# Patient Record
Sex: Male | Born: 1960 | Race: White | Hispanic: No | Marital: Married | Smoking: Former smoker
Health system: Southern US, Community
[De-identification: ages and names within clinical notes are randomized; demographics above are authoritative.]

## PROBLEM LIST (undated history)

## (undated) DIAGNOSIS — I10 Essential (primary) hypertension: Secondary | ICD-10-CM

## (undated) DIAGNOSIS — T7840XA Allergy, unspecified, initial encounter: Secondary | ICD-10-CM

## (undated) DIAGNOSIS — E119 Type 2 diabetes mellitus without complications: Secondary | ICD-10-CM

## (undated) DIAGNOSIS — E785 Hyperlipidemia, unspecified: Secondary | ICD-10-CM

## (undated) HISTORY — DX: Allergy, unspecified, initial encounter: T78.40XA

## (undated) HISTORY — PX: CHOLECYSTECTOMY: SHX55

## (undated) HISTORY — DX: Type 2 diabetes mellitus without complications: E11.9

## (undated) HISTORY — PX: VASECTOMY: SHX75

## (undated) HISTORY — DX: Hyperlipidemia, unspecified: E78.5

## (undated) HISTORY — PX: ANAL FISSURECTOMY: SUR608

## (undated) HISTORY — PX: BACK SURGERY: SHX140

## (undated) HISTORY — PX: SPINE SURGERY: SHX786

## (undated) HISTORY — DX: Essential (primary) hypertension: I10

## (undated) HISTORY — PX: HEMORRHOID SURGERY: SHX153

---

## 2005-08-27 ENCOUNTER — Ambulatory Visit: Payer: Self-pay | Admitting: Chiropractic Medicine

## 2005-10-09 ENCOUNTER — Ambulatory Visit (HOSPITAL_COMMUNITY): Admission: RE | Admit: 2005-10-09 | Discharge: 2005-10-09 | Payer: Self-pay | Admitting: Neurosurgery

## 2015-07-15 ENCOUNTER — Other Ambulatory Visit: Payer: Self-pay | Admitting: Family Medicine

## 2015-07-15 DIAGNOSIS — I10 Essential (primary) hypertension: Secondary | ICD-10-CM

## 2015-07-15 MED ORDER — VALSARTAN-HYDROCHLOROTHIAZIDE 320-12.5 MG PO TABS: 1.0000 | ORAL_TABLET | Freq: Every day | ORAL | Status: DC

## 2019-12-26 LAB — BASIC METABOLIC PANEL
BUN: 20 (ref 4–21)
CO2: 22 (ref 13–22)
Chloride: 100 (ref 99–108)
Creatinine: 0.8 (ref 0.6–1.3)
Glucose: 123

## 2019-12-26 LAB — COMPREHENSIVE METABOLIC PANEL
Albumin: 4.4 (ref 3.5–5.0)
Calcium: 9.8 (ref 8.7–10.7)

## 2019-12-26 LAB — HEMOGLOBIN A1C
Hemoglobin A1C: 5.9
Hemoglobin A1C: 5.9

## 2019-12-26 LAB — HEPATIC FUNCTION PANEL
Alkaline Phosphatase: 45 (ref 25–125)
Bilirubin, Total: 0.3

## 2020-01-14 LAB — BASIC METABOLIC PANEL
BUN: 20 (ref 4–21)
Creatinine: 0.8 (ref 0.6–1.3)

## 2020-01-14 LAB — HEPATIC FUNCTION PANEL
Alkaline Phosphatase: 45 (ref 25–125)
Bilirubin, Total: 0.3

## 2020-01-14 LAB — LIPID PANEL
Cholesterol: 6 (ref 0–200)
HDL: 34 — AB (ref 35–70)
LDL Cholesterol: 121
LDl/HDL Ratio: 5.9
Triglycerides: 220 — AB (ref 40–160)

## 2020-01-14 LAB — COMPREHENSIVE METABOLIC PANEL
Albumin: 4.4 (ref 3.5–5.0)
Calcium: 9.8 (ref 8.7–10.7)
GFR calc non Af Amer: 111

## 2020-09-30 ENCOUNTER — Encounter: Payer: Self-pay | Admitting: Family Medicine

## 2020-09-30 ENCOUNTER — Ambulatory Visit (INDEPENDENT_AMBULATORY_CARE_PROVIDER_SITE_OTHER): Payer: PRIVATE HEALTH INSURANCE | Admitting: Family Medicine

## 2020-09-30 VITALS — BP 121/90 | HR 65 | Temp 98.6°F | Ht 71.0 in | Wt 216.0 lb

## 2020-09-30 DIAGNOSIS — M25512 Pain in left shoulder: Secondary | ICD-10-CM | POA: Diagnosis not present

## 2020-09-30 DIAGNOSIS — R7303 Prediabetes: Secondary | ICD-10-CM | POA: Diagnosis not present

## 2020-09-30 DIAGNOSIS — D489 Neoplasm of uncertain behavior, unspecified: Secondary | ICD-10-CM

## 2020-09-30 DIAGNOSIS — Z7689 Persons encountering health services in other specified circumstances: Secondary | ICD-10-CM

## 2020-09-30 DIAGNOSIS — I10 Essential (primary) hypertension: Secondary | ICD-10-CM

## 2020-09-30 DIAGNOSIS — G8929 Other chronic pain: Secondary | ICD-10-CM

## 2020-09-30 LAB — POCT GLYCOSYLATED HEMOGLOBIN (HGB A1C): Hemoglobin A1C: 6 % — AB (ref 4.0–5.6)

## 2020-09-30 MED ORDER — VALSARTAN-HYDROCHLOROTHIAZIDE 320-12.5 MG PO TABS: 1.0000 | ORAL_TABLET | Freq: Every day | ORAL | 3 refills | Status: DC

## 2020-09-30 NOTE — Progress Notes (Signed)
New patient visit   Patient: Steven Ramirez   DOB: 1961/03/26   60 y.o. Male  MRN: [id number] Visit Date: 09/30/2020  Today's healthcare provider: Vernie Murders, PA-C   Chief Complaint  Patient presents with   Establish Care   Subjective    Steven Ramirez is a 60 y.o. male who presents today as a new patient to establish care.   History of : Diabetes/Prediabetes Hypertension Left shoulder pain posteriorly. Lesion on right posterior neck.   No past medical history on file.   Past Surgical History:  Procedure Laterality Date   ANAL FISSURECTOMY     HEMORRHOID SURGERY     SPINE SURGERY     VASECTOMY     Family Status  Relation Name Status   Mother  Alive   Father  Alive   Brother  Alive   Daughter  Alive   Brother  Alive   Neg Hx  (Not Specified)   Family History  Problem Relation Age of Onset   Crohn's disease Mother    Hypothyroidism Mother    Gout Father    Hypertension Father    Diabetes Brother    Hypothyroidism Daughter    Diabetes Brother    Colon cancer Neg Hx    Prostate cancer Neg Hx    Breast cancer Neg Hx    Social History   Socioeconomic History   Marital status: Married    Spouse name: Not on file   Number of children: Not on file   Years of education: Not on file   Highest education level: Not on file  Occupational History   Not on file  Tobacco Use   Smoking status: Never Smoker   Smokeless tobacco: Never Used  Vaping Use   Vaping Use: Never used  Substance and Sexual Activity   Alcohol use: Yes    Alcohol/week: 15.0 standard drinks    Types: 15 Glasses of wine per week   Drug use: Never   Sexual activity: Not on file  Other Topics Concern   Not on file  Social History Narrative   Not on file   Social Determinants of Health   Financial Resource Strain: Not on file  Food Insecurity: Not on file  Transportation Needs: Not on file  Physical Activity: Not on file  Stress: Not on file  Social Connections: Not on file    Outpatient Medications Prior to Visit  Medication Sig   valsartan-hydrochlorothiazide (DIOVAN HCT) 320-12.5 MG tablet Take 1 tablet by mouth daily.   No facility-administered medications prior to visit.   Allergies  Allergen Reactions   Peach [Prunus Persica]     There is no immunization history on file for this patient.  Health Maintenance  Topic Date Due   COVID-19 Vaccine (1) Never done   HIV Screening  Never done   Hepatitis C Screening  Never done   COLONOSCOPY (Pts 45-36yrs Insurance coverage will need to be confirmed)  Never done   TETANUS/TDAP  02/04/2020   INFLUENZA VACCINE  12/12/2020   HPV VACCINES  Aged Out    Patient Care Team: Chong January, Driscilla Grammes as PCP - General (Family Medicine)  Review of Systems  Constitutional: Negative.   HENT: Negative.    Respiratory: Negative.    Cardiovascular: Negative.   Gastrointestinal: Negative.   Musculoskeletal:        Left shoulder pain.  Skin:  Positive for rash.     Objective    BP 121/90 (BP  Location: Right Arm, Patient Position: Sitting, Cuff Size: Large)   Pulse 65   Temp 98.6 F (37 C) (Oral)   Ht 5\' 11"  (1.803 m)   Wt 216 lb (98 kg)   SpO2 98%   BMI 30.13 kg/m  Physical Exam Constitutional:      Appearance: He is well-developed.  HENT:     Head: Normocephalic and atraumatic.     Right Ear: External ear normal.     Left Ear: External ear normal.     Nose: Nose normal.  Eyes:     General:        Right eye: No discharge.     Conjunctiva/sclera: Conjunctivae normal.     Pupils: Pupils are equal, round, and reactive to light.  Neck:     Thyroid: No thyromegaly.     Trachea: No tracheal deviation.  Cardiovascular:     Rate and Rhythm: Normal rate and regular rhythm.     Heart sounds: Normal heart sounds. No murmur heard.   Pulmonary:     Effort: Pulmonary effort is normal. No respiratory distress.     Breath sounds: Normal breath sounds. No wheezing or rales.  Chest:     Chest wall: No  tenderness.  Abdominal:     General: There is no distension.     Palpations: Abdomen is soft. There is no mass.     Tenderness: There is no abdominal tenderness. There is no guarding or rebound.  Musculoskeletal:        General: Tenderness present. Normal range of motion.     Cervical back: Normal range of motion and neck supple.     Comments: Discomfort in the left posterior shoulder to reach behind back or in an overhead throwing motion.  Lymphadenopathy:     Cervical: No cervical adenopathy.  Skin:    General: Skin is warm and dry.     Findings: Lesion present. No erythema or rash.     Comments: Cobblestone appearing lesion in a 2 cm diameter area on the right posterior neck.  Neurological:     Mental Status: He is alert and oriented to person, place, and time.     Cranial Nerves: No cranial nerve deficit.     Motor: No abnormal muscle tone.     Coordination: Coordination normal.     Deep Tendon Reflexes: Reflexes are normal and symmetric. Reflexes normal.  Psychiatric:        Behavior: Behavior normal.        Thought Content: Thought content normal.        Judgment: Judgment normal.     No results found for any visits on 09/30/20.  Assessment & Plan     1. Encounter to establish care Moving back to the [facility] area and wanting to re-establish in this practice.   2. Prediabetes Hgb A1C up to 6.0% today. Recommend low fat reduced calorie diet to help bring BMI down (over 30 today). Should exercise 30-40 minutes 3-4 days a week (walking). Check routine labs. - POCT glycosylated hemoglobin (Hb A1C) - CBC with Differential/Platelet - Comprehensive metabolic panel - Lipid panel  3. Essential hypertension BP borderline today. Needs refill of Diovan-HCT and get follow up labs. - CBC with Differential/Platelet - Comprehensive metabolic panel - Lipid panel - TSH - valsartan-hydrochlorothiazide (DIOVAN HCT) 320-12.5 MG tablet; Take 1 tablet by mouth daily.  Dispense: 90  tablet; Refill: 3  4. Chronic left shoulder pain No specific injury known. Pain to reach behind back or  overhead in a throwing motion for the past several months. May use Aleve and stretching exercises. If no better soon, may need orthopedic referral.  5. Neoplasm of uncertain behavior Onset several months ago. Recommend dermatology evaluation. - Ambulatory referral to Dermatology   No follow-ups on file.     I, Abdulkarim Eberlin, PA-C, have reviewed all documentation for this visit. The documentation on 09/30/20 for the exam, diagnosis, procedures, and orders are all accurate and complete.    Vernie Murders, PA-C  [employer] [phone] (phone) [phone] (fax)  [place]

## 2020-10-22 LAB — CBC WITH DIFFERENTIAL/PLATELET
Basophils Absolute: 0 10*3/uL (ref 0.0–0.2)
Basos: 1 %
EOS (ABSOLUTE): 0.2 10*3/uL (ref 0.0–0.4)
Eos: 4 %
Hematocrit: 42.7 % (ref 37.5–51.0)
Hemoglobin: 14.6 g/dL (ref 13.0–17.7)
Immature Grans (Abs): 0 10*3/uL (ref 0.0–0.1)
Immature Granulocytes: 0 %
Lymphocytes Absolute: 2.2 10*3/uL (ref 0.7–3.1)
Lymphs: 47 %
MCH: 31.8 pg (ref 26.6–33.0)
MCHC: 34.2 g/dL (ref 31.5–35.7)
MCV: 93 fL (ref 79–97)
Monocytes Absolute: 0.5 10*3/uL (ref 0.1–0.9)
Monocytes: 11 %
Neutrophils Absolute: 1.7 10*3/uL (ref 1.4–7.0)
Neutrophils: 37 %
Platelets: 154 10*3/uL (ref 150–450)
RBC: 4.59 x10E6/uL (ref 4.14–5.80)
RDW: 12.6 % (ref 11.6–15.4)
WBC: 4.5 10*3/uL (ref 3.4–10.8)

## 2020-10-22 LAB — COMPREHENSIVE METABOLIC PANEL
ALT: 50 IU/L — ABNORMAL HIGH (ref 0–44)
AST: 26 IU/L (ref 0–40)
Albumin/Globulin Ratio: 1.8 (ref 1.2–2.2)
Albumin: 4.6 g/dL (ref 3.8–4.9)
Alkaline Phosphatase: 43 IU/L — ABNORMAL LOW (ref 44–121)
BUN/Creatinine Ratio: 18 (ref 10–24)
BUN: 20 mg/dL (ref 8–27)
Bilirubin Total: 0.8 mg/dL (ref 0.0–1.2)
CO2: 25 mmol/L (ref 20–29)
Calcium: 10 mg/dL (ref 8.6–10.2)
Chloride: 101 mmol/L (ref 96–106)
Creatinine, Ser: 1.1 mg/dL (ref 0.76–1.27)
Globulin, Total: 2.5 g/dL (ref 1.5–4.5)
Glucose: 118 mg/dL — ABNORMAL HIGH (ref 65–99)
Potassium: 4.1 mmol/L (ref 3.5–5.2)
Sodium: 141 mmol/L (ref 134–144)
Total Protein: 7.1 g/dL (ref 6.0–8.5)
eGFR: 77 mL/min/{1.73_m2} (ref >59)

## 2020-10-22 LAB — LIPID PANEL
Chol/HDL Ratio: 5.8 ratio — ABNORMAL HIGH (ref 0.0–5.0)
Cholesterol, Total: 193 mg/dL (ref 100–199)
HDL: 33 mg/dL — ABNORMAL LOW (ref >39)
LDL Chol Calc (NIH): 118 mg/dL — ABNORMAL HIGH (ref 0–99)
Triglycerides: 239 mg/dL — ABNORMAL HIGH (ref 0–149)
VLDL Cholesterol Cal: 42 mg/dL — ABNORMAL HIGH (ref 5–40)

## 2020-10-22 LAB — TSH: TSH: 2.67 u[IU]/mL (ref 0.450–4.500)

## 2020-10-23 ENCOUNTER — Encounter: Payer: Self-pay | Admitting: Family Medicine

## 2020-10-24 ENCOUNTER — Encounter: Payer: Self-pay | Admitting: Family Medicine

## 2020-10-24 ENCOUNTER — Telehealth: Payer: Self-pay

## 2020-10-24 MED ORDER — ATORVASTATIN CALCIUM 20 MG PO TABS: 20.0000 mg | ORAL_TABLET | Freq: Every day | ORAL | 3 refills | Status: DC

## 2020-10-24 NOTE — Telephone Encounter (Signed)
-----   Message from Margo Common, PA-C sent at 10/24/2020  8:23 AM EDT ----- All blood tests essentially normal except blood sugar slightly up with Hgb A1C in prediabetic range. LDL cholesterol elevated, HDL cholesterol low and triglycerides high. Need Atorvastatin 20 mg qd #90 & 3RF with low fat diet. Reducing alcohol intake would help sugar, also. Should recheck progress in 3 months.

## 2020-10-31 ENCOUNTER — Encounter: Payer: Self-pay | Admitting: Family Medicine

## 2020-11-04 ENCOUNTER — Encounter: Payer: Self-pay | Admitting: Family Medicine

## 2021-03-15 ENCOUNTER — Other Ambulatory Visit: Payer: Self-pay

## 2021-03-15 ENCOUNTER — Ambulatory Visit: Payer: 59 | Admitting: Dermatology

## 2021-03-15 DIAGNOSIS — L281 Prurigo nodularis: Secondary | ICD-10-CM

## 2021-03-15 DIAGNOSIS — D229 Melanocytic nevi, unspecified: Secondary | ICD-10-CM

## 2021-03-15 DIAGNOSIS — Z1283 Encounter for screening for malignant neoplasm of skin: Secondary | ICD-10-CM | POA: Diagnosis not present

## 2021-03-15 DIAGNOSIS — L814 Other melanin hyperpigmentation: Secondary | ICD-10-CM

## 2021-03-15 DIAGNOSIS — Q825 Congenital non-neoplastic nevus: Secondary | ICD-10-CM

## 2021-03-15 DIAGNOSIS — L570 Actinic keratosis: Secondary | ICD-10-CM

## 2021-03-15 DIAGNOSIS — L821 Other seborrheic keratosis: Secondary | ICD-10-CM

## 2021-03-15 DIAGNOSIS — L280 Lichen simplex chronicus: Secondary | ICD-10-CM | POA: Diagnosis not present

## 2021-03-15 DIAGNOSIS — L820 Inflamed seborrheic keratosis: Secondary | ICD-10-CM | POA: Diagnosis not present

## 2021-03-15 DIAGNOSIS — L578 Other skin changes due to chronic exposure to nonionizing radiation: Secondary | ICD-10-CM | POA: Diagnosis not present

## 2021-03-15 DIAGNOSIS — D1801 Hemangioma of skin and subcutaneous tissue: Secondary | ICD-10-CM

## 2021-03-15 MED ORDER — MOMETASONE FUROATE 0.1 % EX CREA: 1.0000 "application " | TOPICAL_CREAM | Freq: Every day | CUTANEOUS | 2 refills | Status: DC

## 2021-03-15 NOTE — Progress Notes (Signed)
New Patient Visit  Subjective  Steven Ramirez is a 60 y.o. male who presents for the following: Total body skin exam (No hx of skin ca) and check spot (Post scalp, non healing). New patient referral from [facility], Vernie Murders, PA-C The patient presents for Total-Body Skin Exam (TBSE) for skin cancer screening and mole check.  The following portions of the chart were reviewed this encounter and updated as appropriate:   Tobacco  Allergies  Meds  Problems  Med Hx  Surg Hx  Fam Hx     Review of Systems:  No other skin or systemic complaints except as noted in HPI or Assessment and Plan.  Objective  Well appearing patient in no apparent distress; mood and affect are within normal limits.  A full examination was performed including scalp, head, eyes, ears, nose, lips, neck, chest, axillae, abdomen, back, buttocks, bilateral upper extremities, bilateral lower extremities, hands, feet, fingers, toes, fingernails, and toenails. All findings within normal limits unless otherwise noted below.  R antecubital/forearm 1.5 x 0.8cm R post neck thickened skin with hyperkeratosis and lichenification  4.0 x 2.0cm hickened skin with hyperkeratosis and lichenification        R xyphoid Brown macule     Scalp, ears x 12 (12) Pink scaly macules   R medial knee x 1, Total = 1 Erythematous keratotic or waxy stuck-on papule or plaque.       Assessment & Plan   Lentigines - Scattered tan macules - Due to sun exposure - Benign-appearing, observe - Recommend daily broad spectrum sunscreen SPF 30+ to sun-exposed areas, reapply every 2 hours as needed. - Call for any changes  Seborrheic Keratoses - Stuck-on, waxy, tan-brown papules and/or plaques  - Benign-appearing - Discussed benign etiology and prognosis. - Observe - Call for any changes  Melanocytic Nevi - Tan-brown and/or pink-flesh-colored symmetric macules and papules - Benign appearing on exam  today - Observation - Call clinic for new or changing moles - Recommend daily use of broad spectrum spf 30+ sunscreen to sun-exposed areas.   Hemangiomas - Red papules - Discussed benign nature - Observe - Call for any changes  Actinic Damage - Chronic condition, secondary to cumulative UV/sun exposure - diffuse scaly erythematous macules with underlying dyspigmentation - Recommend daily broad spectrum sunscreen SPF 30+ to sun-exposed areas, reapply every 2 hours as needed.  - Staying in the shade or wearing long sleeves, sun glasses (UVA+UVB protection) and wide brim hats (4-inch brim around the entire circumference of the hat) are also recommended for sun protection.  - Call for new or changing lesions.  Skin cancer screening performed today.  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 Your physician has selected the right strength medicine for your problem and area affected on the body. Please use your medication only as directed by your physician to prevent side effects.    mometasone (ELOCON) 0.1 % cream - R antecubital/forearm Apply 1 application topically daily. Qd up to 5 days a week to aa neck, right arm until clear then prn flares  Congenital non-neoplastic nevus R xyphoid Benign-appearing.  Observation.  Call clinic  for new or changing moles.  Recommend daily use of broad spectrum spf 30+ sunscreen to sun-exposed areas.    AK (actinic keratosis) (12) Scalp, ears x 12 Destruction of lesion - Scalp, ears x 12 Complexity: simple    Destruction method: cryotherapy   Informed consent: discussed and consent obtained   Timeout:  patient name, date of birth, surgical site, and procedure verified Lesion destroyed using liquid nitrogen: Yes   Region frozen until ice ball extended beyond lesion: Yes   Outcome: patient tolerated procedure well with no complications   Post-procedure details: wound care instructions given    Inflamed seborrheic keratosis with lichen simplex R medial knee x 1, Total = 1 With [facility]  May start Mometasone cream qd up to 5d/wk  Destruction of lesion - R medial knee x 1, Total = 1 Complexity: simple   Destruction method: cryotherapy   Informed consent: discussed and consent obtained   Timeout:  patient name, date of birth, surgical site, and procedure verified Lesion destroyed using liquid nitrogen: Yes   Region frozen until ice ball extended beyond lesion: Yes   Outcome: patient tolerated procedure well with no complications   Post-procedure details: wound care instructions given    Skin cancer screening  Return in about 3 months (around 06/15/2021) for Ak f/u, [place] f/u, ISk f/u.  I, Othelia Pulling, RMA, am acting as scribe for Sarina Ser, MD . Documentation: I have reviewed the above documentation for accuracy and completeness, and I agree with the above.  Sarina Ser, MD

## 2021-03-15 NOTE — Patient Instructions (Addendum)

## 2021-03-16 ENCOUNTER — Encounter: Payer: Self-pay | Admitting: Dermatology

## 2021-06-22 ENCOUNTER — Ambulatory Visit: Payer: 59 | Admitting: Dermatology

## 2021-08-14 ENCOUNTER — Ambulatory Visit (INDEPENDENT_AMBULATORY_CARE_PROVIDER_SITE_OTHER): Payer: 59 | Admitting: Nurse Practitioner

## 2021-08-14 ENCOUNTER — Encounter: Payer: Self-pay | Admitting: Nurse Practitioner

## 2021-08-14 ENCOUNTER — Ambulatory Visit: Payer: 59 | Admitting: Nurse Practitioner

## 2021-08-14 ENCOUNTER — Other Ambulatory Visit: Payer: Self-pay

## 2021-08-14 VITALS — BP 136/78 | HR 66 | Temp 96.9°F | Resp 12 | Ht 71.85 in | Wt 223.4 lb

## 2021-08-14 DIAGNOSIS — I10 Essential (primary) hypertension: Secondary | ICD-10-CM | POA: Diagnosis not present

## 2021-08-14 DIAGNOSIS — Z1211 Encounter for screening for malignant neoplasm of colon: Secondary | ICD-10-CM

## 2021-08-14 DIAGNOSIS — Z8639 Personal history of other endocrine, nutritional and metabolic disease: Secondary | ICD-10-CM | POA: Diagnosis not present

## 2021-08-14 DIAGNOSIS — E6609 Other obesity due to excess calories: Secondary | ICD-10-CM

## 2021-08-14 DIAGNOSIS — Z0000 Encounter for general adult medical examination without abnormal findings: Secondary | ICD-10-CM

## 2021-08-14 DIAGNOSIS — Z125 Encounter for screening for malignant neoplasm of prostate: Secondary | ICD-10-CM | POA: Diagnosis not present

## 2021-08-14 DIAGNOSIS — Z6830 Body mass index (BMI) 30.0-30.9, adult: Secondary | ICD-10-CM

## 2021-08-14 DIAGNOSIS — E785 Hyperlipidemia, unspecified: Secondary | ICD-10-CM | POA: Insufficient documentation

## 2021-08-14 LAB — COMPREHENSIVE METABOLIC PANEL
ALT: 48 U/L (ref 0–53)
AST: 27 U/L (ref 0–37)
Albumin: 4.7 g/dL (ref 3.5–5.2)
Alkaline Phosphatase: 37 U/L — ABNORMAL LOW (ref 39–117)
BUN: 21 mg/dL (ref 6–23)
CO2: 32 mEq/L (ref 19–32)
Calcium: 10.3 mg/dL (ref 8.4–10.5)
Chloride: 101 mEq/L (ref 96–112)
Creatinine, Ser: 0.91 mg/dL (ref 0.40–1.50)
GFR: 91.23 mL/min (ref >60.00)
Glucose, Bld: 117 mg/dL — ABNORMAL HIGH (ref 70–99)
Potassium: 4.3 mEq/L (ref 3.5–5.1)
Sodium: 138 mEq/L (ref 135–145)
Total Bilirubin: 0.7 mg/dL (ref 0.2–1.2)
Total Protein: 7.5 g/dL (ref 6.0–8.3)

## 2021-08-14 LAB — CBC WITH DIFFERENTIAL/PLATELET
Basophils Absolute: 0 10*3/uL (ref 0.0–0.1)
Basophils Relative: 0.8 % (ref 0.0–3.0)
Eosinophils Absolute: 0.1 10*3/uL (ref 0.0–0.7)
Eosinophils Relative: 2.5 % (ref 0.0–5.0)
HCT: 43.6 % (ref 39.0–52.0)
Hemoglobin: 15.1 g/dL (ref 13.0–17.0)
Lymphocytes Relative: 38.1 % (ref 12.0–46.0)
Lymphs Abs: 1.8 10*3/uL (ref 0.7–4.0)
MCHC: 34.5 g/dL (ref 30.0–36.0)
MCV: 95.1 fl (ref 78.0–100.0)
Monocytes Absolute: 0.6 10*3/uL (ref 0.1–1.0)
Monocytes Relative: 12.1 % — ABNORMAL HIGH (ref 3.0–12.0)
Neutro Abs: 2.2 10*3/uL (ref 1.4–7.7)
Neutrophils Relative %: 46.5 % (ref 43.0–77.0)
Platelets: 184 10*3/uL (ref 150.0–400.0)
RBC: 4.59 Mil/uL (ref 4.22–5.81)
RDW: 12.9 % (ref 11.5–15.5)
WBC: 4.8 10*3/uL (ref 4.0–10.5)

## 2021-08-14 LAB — LIPID PANEL
Cholesterol: 153 mg/dL (ref 0–200)
HDL: 35.1 mg/dL — ABNORMAL LOW (ref >39.00)
LDL Cholesterol: 81 mg/dL (ref 0–99)
NonHDL: 117.96
Total CHOL/HDL Ratio: 4
Triglycerides: 185 mg/dL — ABNORMAL HIGH (ref 0.0–149.0)
VLDL: 37 mg/dL (ref 0.0–40.0)

## 2021-08-14 LAB — PSA: PSA: 0.9 ng/mL (ref 0.10–4.00)

## 2021-08-14 LAB — TSH: TSH: 1.99 u[IU]/mL (ref 0.35–5.50)

## 2021-08-14 LAB — HEMOGLOBIN A1C: Hgb A1c MFr Bld: 6.1 % (ref 4.6–6.5)

## 2021-08-14 MED ORDER — VALSARTAN-HYDROCHLOROTHIAZIDE 320-12.5 MG PO TABS: 1.0000 | ORAL_TABLET | Freq: Every day | ORAL | 3 refills | Status: DC

## 2021-08-14 MED ORDER — NA SULFATE-K SULFATE-MG SULF 17.5-3.13-1.6 GM/177ML PO SOLN: 1.0000 | Freq: Once | ORAL | 0 refills | Status: AC

## 2021-08-14 MED ORDER — ATORVASTATIN CALCIUM 20 MG PO TABS: 20.0000 mg | ORAL_TABLET | Freq: Every day | ORAL | 3 refills | Status: DC

## 2021-08-14 NOTE — Progress Notes (Signed)
Gastroenterology Pre-Procedure Review ? ?Request Date: 09/25/2021 ?Requesting Physician: Dr. Vicente Males ? ?PATIENT REVIEW QUESTIONS: The patient responded to the following health history questions as indicated:   ? ?1. Are you having any GI issues? no ?2. Do you have a personal history of Polyps? no ?3. Do you have a family history of Colon Cancer or Polyps? no ?4. Diabetes Mellitus? yes (type 2) ?5. Joint replacements in the past 12 months?no ?6. Major health problems in the past 3 months?no ?7. Any artificial heart valves, MVP, or defibrillator?no ?   ?MEDICATIONS & ALLERGIES:    ?Patient reports the following regarding taking any anticoagulation/antiplatelet therapy:   ?Plavix, Coumadin, Eliquis, Xarelto, Lovenox, Pradaxa, Brilinta, or Effient? no ?Aspirin? no ? ?Patient confirms/reports the following medications:  ?Current Outpatient Medications  ?Medication Sig Dispense Refill  ? atorvastatin (LIPITOR) 20 MG tablet Take 1 tablet (20 mg total) by mouth daily. 90 tablet 3  ? cetirizine (ZYRTEC) 10 MG tablet     ? valsartan-hydrochlorothiazide (DIOVAN HCT) 320-12.5 MG tablet Take 1 tablet by mouth daily. 90 tablet 3  ? ?No current facility-administered medications for this visit.  ? ? ?Patient confirms/reports the following allergies:  ?Allergies  ?Allergen Reactions  ? Peach [Prunus Persica]   ? ? ?No orders of the defined types were placed in this encounter. ? ? ?AUTHORIZATION INFORMATION ?Primary Insurance: ?1D#: ?Group #: ? ?Secondary Insurance: ?1D#: ?Group #: ? ?SCHEDULE INFORMATION: ?Date: 09/25/2021 ?Time: ?Location: [facility] ?

## 2021-08-14 NOTE — Addendum Note (Signed)
Addended by: Michela Pitcher on: 08/14/2021 01:09 PM ? ? Modules accepted: Orders ? ?

## 2021-08-14 NOTE — Assessment & Plan Note (Signed)
Patient currently maintained on valsartan-hydrochlorothiazide.  Patient takes medication as prescribed checks blood pressure infrequently.  Continue taking medication as prescribed ?

## 2021-08-14 NOTE — Patient Instructions (Signed)
Nice to see you today ?I will be in touch with the lab results once I have them ?Follow up with me in 1 year, sooner if needed ?

## 2021-08-14 NOTE — Assessment & Plan Note (Signed)
Currently maintained on atorvastatin 20 mg daily.  Did discuss healthy lifestyle modifications.  Pending lab results continue taking atorvastatin 20 mg daily. ?

## 2021-08-14 NOTE — Assessment & Plan Note (Signed)
Patient was doing well with physical activity but as of late he has not done so well.  We did discuss recommendations of 30 minutes a day 5 times a week.  Patient acknowledged.  Continue working on healthy lifestyle modifications ?

## 2021-08-14 NOTE — Progress Notes (Signed)
? ?New Patient Office Visit ? ?Subjective:  ?Patient ID: Steven Ramirez, male    DOB: October 26, 1960  Age: 61 y.o. MRN: [id number] ? ?CC:  ?Chief Complaint  ?Patient presents with  ? Establish Care  ?  Previous PCP [facility]  ? ? ?HPI ?Steven Ramirez presents for establish care ? ?for complete physical and follow up of chronic conditions. ? ?HTN: can check it at home but does not check it regularly. If he feels off he will check it. ? ?Immunizations: ?-Tetanus:2021 ?-Influenza:02/25/2021 ?-Covid-19: UTD ?-Shingles: UTD ?-Pneumonia: NA ? ?-HPV: Aged out ? ?Diet: Fair diet. 2.5 meals a day. Smaller breakfast. Snacks occasionally. Drinks coffee and water ?Exercise: No regular exercise. Was doing yoga 3-4 times a week. Has not been doing the yoga. Will walk some.  Regular exercise currently ? ?Eye exam: Completes annually. Completed last week  ?Dental exam: Completes semi-annually  ? ? ?Colonoscopy: Completed when he was 61 years old.[place]  ?Dexa: 9 ?PSA: Due ? ?Lung Cancer Screening: N/A ? ?Sleep: goes to bed around 11 and gets up around 630-730. Feels rested.  Occasionally he snores ? ?Hearing aids at [employer]. ?Dr. Nehemiah Massed - dermatology  ? ?Past Medical History:  ?Diagnosis Date  ? Allergy 1982  ? hay fever, dust, pets and feathers  ? Diabetes mellitus without complication ([place]) 5573  ? Controlled with diet  ? Hyperlipidemia   ? Hypertension 1995  ? controlled with meds  ? ? ?Past Surgical History:  ?Procedure Laterality Date  ? ANAL FISSURECTOMY    ? HEMORRHOID SURGERY    ? SPINE SURGERY    ? VASECTOMY    ? ? ?Family History  ?Problem Relation Age of Onset  ? Crohn's disease Mother   ? Hypothyroidism Mother   ? Gout Father   ? Hypertension Father   ? Diabetes Brother   ? Diabetes Brother   ? Hypothyroidism Daughter   ? Cancer Paternal Grandmother   ?     lung cancer?  ? Colon cancer Neg Hx   ? Prostate cancer Neg Hx   ? Breast cancer Neg Hx   ? ? ?Social History  ? ?Socioeconomic History  ? Marital  status: Married  ?  Spouse name: Not on file  ? Number of children: 1  ? Years of education: Not on file  ? Highest education level: Not on file  ?Occupational History  ? Not on file  ?Tobacco Use  ? Smoking status: Former  ?  Types: Cigars  ?  Quit date: 05/14/2008  ?  Years since quitting: 13.2  ? Smokeless tobacco: Never  ? Tobacco comments:  ?  Cigars and Skol occasionally until quit date  ?Vaping Use  ? Vaping Use: Never used  ?Substance and Sexual Activity  ? Alcohol use: Yes  ?  Alcohol/week: 21.0 standard drinks  ?  Types: 15 Glasses of wine, 4 Cans of beer, 2 Shots of liquor per week  ?  Comment: 0.5 bottle of wine at night  ? Drug use: Never  ? Sexual activity: Yes  ?  Birth control/protection: Surgical  ?  Comment: vasectomy  ?Other Topics Concern  ? Not on file  ?Social History Narrative  ? Fulltime: Tax inspector  ?   ? Hobbies: hanging out with grandchilds  ? ?Social Determinants of Health  ? ?Financial Resource Strain: Not on file  ?Food Insecurity: Not on file  ?Transportation Needs: Not on file  ?Physical Activity: Not on file  ?Stress:  Not on file  ?Social Connections: Not on file  ?Intimate Partner Violence: Not on file  ? ? ?ROS ?Review of Systems  ?Constitutional:  Negative for chills, fatigue and fever.  ?Respiratory:  Positive for cough (ocass with seasonal change). Negative for shortness of breath.   ?Cardiovascular:  Negative for chest pain and leg swelling.  ?Gastrointestinal:  Negative for diarrhea, nausea and vomiting.  ?     BM  daily ?  ?Genitourinary:  Negative for difficulty urinating, dysuria, frequency, hematuria, penile discharge, penile pain, penile swelling, scrotal swelling and testicular pain.  ?     Nocturia x1 ?  ?Neurological:  Negative for dizziness, weakness, light-headedness, numbness and headaches.  ?Psychiatric/Behavioral:  Negative for hallucinations and suicidal ideas.   ? ?Objective:  ? ?Today's Vitals: BP 136/78   Pulse 66   Temp (!) 96.9 ?F (36.1 ?C)    Resp 12   Ht 5' 11.85" (1.825 m)   Wt 223 lb 6 oz (101.3 kg)   SpO2 97%   BMI 30.42 kg/m?  ? ?Physical Exam ?Vitals and nursing note reviewed. Exam conducted with a chaperone present [facility], RMA).  ?Constitutional:   ?   Appearance: Normal appearance.  ?HENT:  ?   Right Ear: Tympanic membrane, ear canal and external ear normal.  ?   Left Ear: Tympanic membrane, ear canal and external ear normal.  ?   Ears:  ?   Comments: Wears hearing aids ?   Mouth/Throat:  ?   Mouth: Mucous membranes are moist.  ?   Pharynx: Oropharynx is clear. No posterior oropharyngeal erythema.  ?Eyes:  ?   Extraocular Movements: Extraocular movements intact.  ?   Pupils: Pupils are equal, round, and reactive to light.  ?   Comments: Wears corrective lenses  ?Neck:  ?   Thyroid: No thyroid mass, thyromegaly or thyroid tenderness.  ?Cardiovascular:  ?   Rate and Rhythm: Normal rate and regular rhythm.  ?   Pulses: Normal pulses.  ?   Heart sounds: Normal heart sounds.  ?Pulmonary:  ?   Effort: Pulmonary effort is normal.  ?   Breath sounds: Normal breath sounds.  ?Abdominal:  ?   General: Bowel sounds are normal. There is no distension.  ?   Palpations: There is no mass.  ?   Tenderness: There is no abdominal tenderness.  ?   Hernia: No hernia is present. There is no hernia in the left inguinal area or right inguinal area.  ?Genitourinary: ?   Penis: Normal and circumcised.   ?   Testes: Normal.  ?   Epididymis:  ?   Right: Normal.  ?   Left: Normal.  ?Musculoskeletal:  ?   Right lower leg: No edema.  ?   Left lower leg: No edema.  ?Lymphadenopathy:  ?   Cervical: No cervical adenopathy.  ?   Lower Body: No right inguinal adenopathy. No left inguinal adenopathy.  ?Skin: ?   General: Skin is warm.  ?Neurological:  ?   General: No focal deficit present.  ?   Mental Status: He is alert.  ?   Deep Tendon Reflexes:  ?   Reflex Scores: ?     Bicep reflexes are 2+ on the right side and 2+ on the left side. ?     Patellar reflexes  are 2+ on the right side and 2+ on the left side. ?   Comments: Bilateral upper and lower extremity strength 5/5  ?Psychiatric:     ?  Mood and Affect: Mood normal.     ?   Behavior: Behavior normal.     ?   Thought Content: Thought content normal.     ?   Judgment: Judgment normal.  ? ? ?Assessment & Plan:  ? ?Problem List Items Addressed This Visit   ? ?  ? Cardiovascular and Mediastinum  ? Essential hypertension  ?  Patient currently maintained on valsartan-hydrochlorothiazide.  Patient takes medication as prescribed checks blood pressure infrequently.  Continue taking medication as prescribed ?  ?  ? Relevant Medications  ? valsartan-hydrochlorothiazide (DIOVAN HCT) 320-12.5 MG tablet  ? atorvastatin (LIPITOR) 20 MG tablet  ?  ? Other  ? History of diabetes mellitus  ?  History of diabetes mellitus type 2.  He does have 2 siblings and has diabetes.  He is currently maintained on diet.  Patient states he is gained 20 pounds over the past year and a half.  Pending lab results ?  ?  ? Relevant Orders  ? Hemoglobin A1c  ? Lipid panel  ? Class 1 obesity due to excess calories with serious comorbidity and body mass index (BMI) of 30.0 to 30.9 in adult  ?  Patient was doing well with physical activity but as of late he has not done so well.  We did discuss recommendations of 30 minutes a day 5 times a week.  Patient acknowledged.  Continue working on healthy lifestyle modifications ?  ?  ? Hyperlipidemia  ?  Currently maintained on atorvastatin 20 mg daily.  Did discuss healthy lifestyle modifications.  Pending lab results continue taking atorvastatin 20 mg daily. ?  ?  ? Relevant Medications  ? valsartan-hydrochlorothiazide (DIOVAN HCT) 320-12.5 MG tablet  ? atorvastatin (LIPITOR) 20 MG tablet  ? ?Other Visit Diagnoses   ? ? Preventative health care    -  Primary  ? Relevant Orders  ? Comprehensive metabolic panel  ? CBC with Differential/Platelet  ? Hemoglobin A1c  ? TSH  ? Lipid panel  ? Screening for prostate  cancer      ? Relevant Orders  ? PSA  ? Screening for colon cancer      ? Relevant Orders  ? Ambulatory referral to Gastroenterology  ? ?  ? ? ?Outpatient Encounter Medications as of 08/14/2021  ?Medication Sig  ?

## 2021-08-14 NOTE — Assessment & Plan Note (Signed)
History of diabetes mellitus type 2.  He does have 2 siblings and has diabetes.  He is currently maintained on diet.  Patient states he is gained 20 pounds over the past year and a half.  Pending lab results ?

## 2021-08-16 ENCOUNTER — Encounter: Payer: Self-pay | Admitting: Nurse Practitioner

## 2021-08-17 ENCOUNTER — Telehealth: Payer: Self-pay

## 2021-08-17 ENCOUNTER — Telehealth: Payer: Self-pay | Admitting: Gastroenterology

## 2021-08-17 NOTE — Telephone Encounter (Signed)
Pt has procedure sched for 05/15 and would like to cancel and resche for 05/19 if possible ?

## 2021-08-17 NOTE — Telephone Encounter (Signed)
RESCHEDULED FOR 09/29/2021 SENT NEW LETTERS  NEW REFERRAL AND CALLED ENDO ?

## 2021-08-21 ENCOUNTER — Ambulatory Visit (INDEPENDENT_AMBULATORY_CARE_PROVIDER_SITE_OTHER): Payer: 59 | Admitting: Dermatology

## 2021-08-21 DIAGNOSIS — L578 Other skin changes due to chronic exposure to nonionizing radiation: Secondary | ICD-10-CM

## 2021-08-21 DIAGNOSIS — L821 Other seborrheic keratosis: Secondary | ICD-10-CM

## 2021-08-21 DIAGNOSIS — D229 Melanocytic nevi, unspecified: Secondary | ICD-10-CM

## 2021-08-21 DIAGNOSIS — L280 Lichen simplex chronicus: Secondary | ICD-10-CM | POA: Diagnosis not present

## 2021-08-21 DIAGNOSIS — L570 Actinic keratosis: Secondary | ICD-10-CM | POA: Diagnosis not present

## 2021-08-21 DIAGNOSIS — L814 Other melanin hyperpigmentation: Secondary | ICD-10-CM

## 2021-08-21 DIAGNOSIS — L820 Inflamed seborrheic keratosis: Secondary | ICD-10-CM | POA: Diagnosis not present

## 2021-08-21 DIAGNOSIS — Z1283 Encounter for screening for malignant neoplasm of skin: Secondary | ICD-10-CM | POA: Diagnosis not present

## 2021-08-21 NOTE — Progress Notes (Signed)
? ?Follow-Up Visit ?  ?Subjective  ?Steven Ramirez is a 61 y.o. male who presents for the following: Actinic Keratosis (Scalp, ears, >34mf/u) and check spot (Back, crusty and itchy, not sure how long he has had just noticed). ?The patient presents for Upper Body Skin Exam (UBSE) for skin cancer screening and mole check.  The patient has spots, moles and lesions to be evaluated, some may be new or changing and the patient has concerns that these could be cancer.  ? ?The following portions of the chart were reviewed this encounter and updated as appropriate:  ? Tobacco  Allergies  Meds  Problems  Med Hx  Surg Hx  Fam Hx   ?  ?Review of Systems:  No other skin or systemic complaints except as noted in HPI or Assessment and Plan. ? ?Objective  ?Well appearing patient in no apparent distress; mood and affect are within normal limits. ? ?All skin waist up examined. ? ?Mid back x 1 ?Stuck on waxy paps with erythema ? ?scalp x 2 (2) ?Pink scaly macules ? ? ?Assessment & Plan  ?Inflamed seborrheic keratosis ?Mid back x 1 ?Destruction of lesion - Mid back x 1 ?Complexity: simple   ?Destruction method: cryotherapy   ?Informed consent: discussed and consent obtained   ?Timeout:  patient name, date of birth, surgical site, and procedure verified ?Lesion destroyed using liquid nitrogen: Yes   ?Region frozen until ice ball extended beyond lesion: Yes   ?Outcome: patient tolerated procedure well with no complications   ?Post-procedure details: wound care instructions given   ? ?AK (actinic keratosis) (2) ?scalp x 2 ?May consider field treatment in the future ?Destruction of lesion - scalp x 2 ?Complexity: simple   ?Destruction method: cryotherapy   ?Informed consent: discussed and consent obtained   ?Timeout:  patient name, date of birth, surgical site, and procedure verified ?Lesion destroyed using liquid nitrogen: Yes   ?Region frozen until ice ball extended beyond lesion: Yes   ?Outcome: patient tolerated procedure well  with no complications   ?Post-procedure details: wound care instructions given   ? ?Lichen simplex chronicus ?Right Antecubital/forearm at previous bite, post neck ?Chronic and persistent condition with duration or expected duration over one year. Condition is symptomatic / bothersome to patient. Not to goal. ?Cont Mometasone cr qd up to 5d/wk until clear, then prn flares ?Avoid picking ?Discussed IL injections ?Discussed Cordran tape ? ?Skin cancer screening ? ?Actinic Damage ?- chronic, secondary to cumulative UV radiation exposure/sun exposure over time ?- diffuse scaly erythematous macules with underlying dyspigmentation ?- Recommend daily broad spectrum sunscreen SPF 30+ to sun-exposed areas, reapply every 2 hours as needed.  ?- Recommend staying in the shade or wearing long sleeves, sun glasses (UVA+UVB protection) and wide brim hats (4-inch brim around the entire circumference of the hat). ?- Call for new or changing lesions.  ? ?Melanocytic Nevi ?- Tan-brown and/or pink-flesh-colored symmetric macules and papules ?- Benign appearing on exam today ?- Observation ?- Call clinic for new or changing moles ?- Recommend daily use of broad spectrum spf 30+ sunscreen to sun-exposed areas.  ? ?Seborrheic Keratoses ?- Stuck-on, waxy, tan-brown papules and/or plaques  ?- Benign-appearing ?- Discussed benign etiology and prognosis. ?- Observe ?- Call for any changes ? ?Lentigines ?- Scattered tan macules ?- Due to sun exposure ?- Benign-appering, observe ?- Recommend daily broad spectrum sunscreen SPF 30+ to sun-exposed areas, reapply every 2 hours as needed. ?- Call for any changes  ? ?Return in about  6 months (around 02/20/2022) for [place] f/u, LSC. ? ?I, Othelia Pulling, RMA, am acting as scribe for Sarina Ser, MD . ?Documentation: I have reviewed the above documentation for accuracy and completeness, and I agree with the above. ? ?Sarina Ser, MD ? ?

## 2021-08-21 NOTE — Patient Instructions (Addendum)

## 2021-08-22 ENCOUNTER — Encounter: Payer: Self-pay | Admitting: Dermatology

## 2021-09-18 ENCOUNTER — Ambulatory Visit: Payer: 59 | Admitting: Adult Health

## 2021-09-23 ENCOUNTER — Encounter: Payer: Self-pay | Admitting: Nurse Practitioner

## 2021-09-29 ENCOUNTER — Ambulatory Visit: Payer: 59 | Admitting: Anesthesiology

## 2021-09-29 ENCOUNTER — Ambulatory Visit
Admission: RE | Admit: 2021-09-29 | Discharge: 2021-09-29 | Disposition: A | Discharge status: Home / Self Care | Payer: 59 | Attending: Gastroenterology | Admitting: Gastroenterology

## 2021-09-29 ENCOUNTER — Encounter: Payer: Self-pay | Admitting: Gastroenterology

## 2021-09-29 ENCOUNTER — Encounter
Admission: RE | Disposition: A | Discharge status: Home / Self Care | Payer: Self-pay | Source: Home / Self Care | Attending: Gastroenterology

## 2021-09-29 DIAGNOSIS — K5730 Diverticulosis of large intestine without perforation or abscess without bleeding: Secondary | ICD-10-CM | POA: Insufficient documentation

## 2021-09-29 DIAGNOSIS — K219 Gastro-esophageal reflux disease without esophagitis: Secondary | ICD-10-CM | POA: Insufficient documentation

## 2021-09-29 DIAGNOSIS — E119 Type 2 diabetes mellitus without complications: Secondary | ICD-10-CM | POA: Insufficient documentation

## 2021-09-29 DIAGNOSIS — D124 Benign neoplasm of descending colon: Secondary | ICD-10-CM | POA: Insufficient documentation

## 2021-09-29 DIAGNOSIS — I10 Essential (primary) hypertension: Secondary | ICD-10-CM | POA: Diagnosis not present

## 2021-09-29 DIAGNOSIS — Z87891 Personal history of nicotine dependence: Secondary | ICD-10-CM | POA: Diagnosis not present

## 2021-09-29 DIAGNOSIS — Z1211 Encounter for screening for malignant neoplasm of colon: Secondary | ICD-10-CM | POA: Diagnosis not present

## 2021-09-29 DIAGNOSIS — K635 Polyp of colon: Secondary | ICD-10-CM

## 2021-09-29 DIAGNOSIS — E785 Hyperlipidemia, unspecified: Secondary | ICD-10-CM | POA: Diagnosis not present

## 2021-09-29 HISTORY — PX: COLONOSCOPY WITH PROPOFOL: SHX5780

## 2021-09-29 SURGERY — COLONOSCOPY WITH PROPOFOL
Anesthesia: General

## 2021-09-29 MED ORDER — LIDOCAINE HCL (PF) 2 % IJ SOLN
INTRAMUSCULAR | Status: AC
  Filled 2021-09-29: qty 5

## 2021-09-29 MED ORDER — SODIUM CHLORIDE 0.9 % IV SOLN
INTRAVENOUS | Status: DC
  Administered 2021-09-29: 1000 mL via INTRAVENOUS

## 2021-09-29 MED ORDER — LIDOCAINE HCL (CARDIAC) PF 100 MG/5ML IV SOSY
PREFILLED_SYRINGE | INTRAVENOUS | Status: DC | PRN
  Administered 2021-09-29: 50 mg via INTRAVENOUS

## 2021-09-29 MED ORDER — PROPOFOL 500 MG/50ML IV EMUL
INTRAVENOUS | Status: AC
  Filled 2021-09-29: qty 50

## 2021-09-29 MED ORDER — PROPOFOL 10 MG/ML IV BOLUS
INTRAVENOUS | Status: DC | PRN
  Administered 2021-09-29: 30 mg via INTRAVENOUS
  Administered 2021-09-29: 70 mg via INTRAVENOUS

## 2021-09-29 MED ORDER — PROPOFOL 500 MG/50ML IV EMUL
INTRAVENOUS | Status: DC | PRN
  Administered 2021-09-29: 100 ug/kg/min via INTRAVENOUS

## 2021-09-29 NOTE — Op Note (Signed)
[name]astroenterology Patient Name: Steven Ramirez Procedure Date: 09/29/2021 9:53 AM MRN: [id number] Account #: [id number] Date of Birth: 25-Oct-1960 Admit Type: Outpatient Age: 61 Room: [facility] ENDO ROOM 4 Gender: Male Note Status: Finalized Instrument Name: Jasper Riling [id number] Procedure:             Colonoscopy Indications:           Screening for colorectal malignant neoplasm Providers:             [name] MD, MD Referring MD:          Alyson Locket. Charmian Muff (Referring MD) Medicines:             Monitored Anesthesia Care Complications:         No immediate complications. Procedure:             Pre-Anesthesia Assessment:                        - Prior to the procedure, a History and Physical was                         performed, and patient medications, allergies and                         sensitivities were reviewed. The patient's tolerance                         of previous anesthesia was reviewed.                        - The risks and benefits of the procedure and the                         sedation options and risks were discussed with the                         patient. All questions were answered and informed                         consent was obtained.                        - ASA Grade Assessment: II - A patient with mild                         systemic disease.                        After obtaining informed consent, the colonoscope was                         passed under direct vision. Throughout the procedure,                         the patient's blood pressure, pulse, and oxygen                         saturations were monitored continuously. The                         Colonoscope was introduced  through the anus and                         advanced to the the cecum, identified by the                         appendiceal orifice. The colonoscopy was performed                         with ease. The patient tolerated the procedure well.                          The quality of the bowel preparation was excellent. Findings:      The perianal and digital rectal examinations were normal.      A 5 mm polyp was found in the descending colon. The polyp was sessile.       The polyp was removed with a hot snare. Resection and retrieval were       complete.      Multiple small-mouthed diverticula were found in the left colon.      The exam was otherwise without abnormality on direct and retroflexion       views. Impression:            - One 5 mm polyp in the sigmoid colon, removed with a                         hot snare. Resected and retrieved.                        - Diverticulosis in the left colon.                        - The examination was otherwise normal on direct and                         retroflexion views. Recommendation:        - Discharge patient to home (with escort).                        - Resume previous diet.                        - Continue present medications.                        - Await pathology results.                        - Repeat colonoscopy for surveillance based on                         pathology results. Procedure Code(s):     --- Professional ---                        [phone], Colonoscopy, flexible; with removal of                         tumor(s), polyp(s), or other lesion(s) by snare  technique CPT copyright 2019 American Medical Association. All rights reserved. The codes documented in this report are preliminary and upon coder review may  be revised to meet current compliance requirements. [name], MD [name] MD, MD 09/29/2021 10:26:46 AM This report has been signed electronically. Number of Addenda: 0 Note Initiated On: 09/29/2021 9:53 AM Scope Withdrawal Time: 0 hours 11 minutes 49 seconds  Total Procedure Duration: 0 hours 15 minutes 19 seconds  Estimated Blood Loss:  Estimated blood loss: none.      [facility]

## 2021-09-29 NOTE — Anesthesia Postprocedure Evaluation (Signed)
Anesthesia Post Note  Patient: Steven Ramirez  Procedure(s) Performed: COLONOSCOPY WITH PROPOFOL  Patient location during evaluation: Endoscopy Anesthesia Type: General Level of consciousness: awake and alert Pain management: pain level controlled Vital Signs Assessment: post-procedure vital signs reviewed and stable Respiratory status: spontaneous breathing, nonlabored ventilation, respiratory function stable and patient connected to nasal cannula oxygen Cardiovascular status: blood pressure returned to baseline and stable Postop Assessment: no apparent nausea or vomiting Anesthetic complications: no   No notable events documented.   Last Vitals:  Vitals:   09/29/21 1037 09/29/21 1047  BP: 129/90 (!) 124/91  Pulse: 63 (!) 56  Resp: 17 20  Temp:    SpO2: 97% 100%    Last Pain:  Vitals:   09/29/21 1047  TempSrc:   PainSc: 0-No pain                 Precious Haws Betzabeth Derringer

## 2021-09-29 NOTE — H&P (Signed)
[name], MD [address], [place], [place], [place], [address] [address], [place], [place], [place], [address] Phone: [phone]  Fax: [phone]  Primary Care Physician:  System, Provider Not In   Pre-Procedure History & Physical: HPI:  Steven Ramirez is a 61 y.o. male is here for an colonoscopy.   Past Medical History:  Diagnosis Date   Allergy 1982   hay fever, dust, pets and feathers   Diabetes mellitus without complication ([place]) 3235   Controlled with diet   Hyperlipidemia    Hypertension 1995   controlled with meds    Past Surgical History:  Procedure Laterality Date   ANAL FISSURECTOMY     BACK SURGERY     HEMORRHOID SURGERY     SPINE SURGERY     VASECTOMY      Prior to Admission medications   Medication Sig Start Date End Date Taking? Authorizing Provider  atorvastatin (LIPITOR) 20 MG tablet Take 1 tablet (20 mg total) by mouth daily. 08/14/21  Yes Michela Pitcher, NP  cetirizine (ZYRTEC) 10 MG tablet  07/08/11  Yes [provider]  valsartan-hydrochlorothiazide (DIOVAN HCT) 320-12.5 MG tablet Take 1 tablet by mouth daily. 08/14/21  Yes Michela Pitcher, NP    Allergies as of 08/14/2021 - Review Complete 08/14/2021  Allergen Reaction Noted   Peach [prunus persica]  09/30/2020    Family History  Problem Relation Age of Onset   Crohn's disease Mother    Hypothyroidism Mother    Gout Father    Hypertension Father    Diabetes Brother    Diabetes Brother    Hypothyroidism Daughter    Cancer Paternal Grandmother        lung cancer?   Colon cancer Neg Hx    Prostate cancer Neg Hx    Breast cancer Neg Hx     Social History   Socioeconomic History   Marital status: Married    Spouse name: Not on file   Number of children: 1   Years of education: Not on file   Highest education level: Not on file  Occupational History   Not on file  Tobacco Use   Smoking status: Former    Types: Cigars    Quit date: 05/14/2008    Years since  quitting: 13.3   Smokeless tobacco: Never   Tobacco comments:    Cigars and Skol occasionally until quit date  Vaping Use   Vaping Use: Never used  Substance and Sexual Activity   Alcohol use: Yes    Alcohol/week: 21.0 standard drinks    Types: 15 Glasses of wine, 4 Cans of beer, 2 Shots of liquor per week    Comment: 0.5 bottle of wine at night   Drug use: Never   Sexual activity: Yes    Birth control/protection: Surgical    Comment: vasectomy  Other Topics Concern   Not on file  Social History Narrative   Fulltime: Tax inspector      Hobbies: hanging out with grandchilds   Social Determinants of Health   Financial Resource Strain: Not on file  Food Insecurity: Not on file  Transportation Needs: Not on file  Physical Activity: Not on file  Stress: Not on file  Social Connections: Not on file  Intimate Partner Violence: Not on file    Review of Systems: See HPI, otherwise negative ROS  Physical Exam: BP (!) 142/90   Pulse 64   Temp (!) 96.2 F (35.7  C) (Temporal)   Resp 18   Ht '5\' 11"'$  (1.803 m)   Wt 100 kg   SpO2 98%   BMI 30.75 kg/m  General:   Alert,  pleasant and cooperative in NAD Head:  Normocephalic and atraumatic. Neck:  Supple; no masses or thyromegaly. Lungs:  Clear throughout to auscultation, normal respiratory effort.    Heart:  +S1, +S2, Regular rate and rhythm, No edema. Abdomen:  Soft, nontender and nondistended. Normal bowel sounds, without guarding, and without rebound.   Neurologic:  Alert and  oriented x4;  grossly normal neurologically.  Impression/Plan: Steven Ramirez is here for an colonoscopy to be performed for Screening colonoscopy average risk   Risks, benefits, limitations, and alternatives regarding  colonoscopy have been reviewed with the patient.  Questions have been answered.  All parties agreeable.   [name], MD  09/29/2021, 10:05 AM

## 2021-09-29 NOTE — Transfer of Care (Signed)
Immediate Anesthesia Transfer of Care Note  Patient: Steven Ramirez  Procedure(s) Performed: COLONOSCOPY WITH PROPOFOL  Patient Location: PACU  Anesthesia Type:General  Level of Consciousness: awake, alert  and oriented  Airway & Oxygen Therapy: Patient Spontanous Breathing  Post-op Assessment: Report given to RN and Post -op Vital signs reviewed and stable  Post vital signs: Reviewed and stable  Last Vitals:  Vitals Value Taken Time  BP 117/84 09/29/21 1028  Temp    Pulse 69 09/29/21 1028  Resp 21 09/29/21 1028  SpO2 96 % 09/29/21 1028    Last Pain:  Vitals:   09/29/21 0932  TempSrc: Temporal  PainSc: 0-No pain         Complications: No notable events documented.

## 2021-09-29 NOTE — Anesthesia Preprocedure Evaluation (Signed)
Anesthesia Evaluation  Patient identified by MRN, date of birth, ID band Patient awake    Reviewed: Allergy & Precautions, NPO status , Patient's Chart, lab work & pertinent test results  History of Anesthesia Complications Negative for: history of anesthetic complications  Airway Mallampati: III  TM Distance: <3 FB Neck ROM: full    Dental  (+) Chipped   Pulmonary neg shortness of breath, former smoker,    Pulmonary exam normal        Cardiovascular Exercise Tolerance: Good hypertension, (-) angina(-) Past MI and (-) DOE Normal cardiovascular exam     Neuro/Psych negative neurological ROS  negative psych ROS   GI/Hepatic negative GI ROS, Neg liver ROS, neg GERD  ,  Endo/Other  diabetes, Type 2  Renal/GU negative Renal ROS  negative genitourinary   Musculoskeletal   Abdominal   Peds  Hematology negative hematology ROS (+)   Anesthesia Other Findings Past Medical History: 1982: Allergy     Comment:  hay fever, dust, pets and feathers 2014: Diabetes mellitus without complication ([place])     Comment:  Controlled with diet No date: Hyperlipidemia 1995: Hypertension     Comment:  controlled with meds  Past Surgical History: No date: ANAL FISSURECTOMY No date: BACK SURGERY No date: HEMORRHOID SURGERY No date: SPINE SURGERY No date: VASECTOMY  BMI    Body Mass Index: 30.75 kg/m      Reproductive/Obstetrics negative OB ROS                             Anesthesia Physical Anesthesia Plan  ASA: 2  Anesthesia Plan: General   Post-op Pain Management:    Induction: Intravenous  PONV Risk Score and Plan: Propofol infusion and TIVA  Airway Management Planned: Natural Airway and Nasal Cannula  Additional Equipment:   Intra-op Plan:   Post-operative Plan:   Informed Consent: I have reviewed the patients History and Physical, chart, labs and discussed the procedure including  the risks, benefits and alternatives for the proposed anesthesia with the patient or authorized representative who has indicated his/her understanding and acceptance.     Dental Advisory Given  Plan Discussed with: Anesthesiologist, CRNA and Surgeon  Anesthesia Plan Comments: (Patient consented for risks of anesthesia including but not limited to:  - adverse reactions to medications - risk of airway placement if required - damage to eyes, teeth, lips or other oral mucosa - nerve damage due to positioning  - sore throat or hoarseness - Damage to heart, brain, nerves, lungs, other parts of body or loss of life  Patient voiced understanding.)        Anesthesia Quick Evaluation

## 2021-10-02 LAB — SURGICAL PATHOLOGY

## 2021-10-03 ENCOUNTER — Encounter: Payer: Self-pay | Admitting: Gastroenterology

## 2021-10-05 ENCOUNTER — Ambulatory Visit (INDEPENDENT_AMBULATORY_CARE_PROVIDER_SITE_OTHER): Payer: 59 | Admitting: Nurse Practitioner

## 2021-10-05 ENCOUNTER — Encounter: Payer: Self-pay | Admitting: Nurse Practitioner

## 2021-10-05 VITALS — BP 128/88 | HR 68 | Temp 97.1°F | Resp 14 | Ht 71.0 in | Wt 223.5 lb

## 2021-10-05 DIAGNOSIS — N528 Other male erectile dysfunction: Secondary | ICD-10-CM | POA: Diagnosis not present

## 2021-10-05 MED ORDER — TADALAFIL 10 MG PO TABS: ORAL_TABLET | ORAL | 0 refills | Status: DC

## 2021-10-05 NOTE — Patient Instructions (Signed)
Nice to see you today Keep me updated on the medication use, side effects, and effects

## 2021-10-05 NOTE — Progress Notes (Signed)
   Established Patient Office Visit  Subjective   Patient ID: Steven Ramirez, male    DOB: 1961-03-29  Age: 61 y.o. MRN: [id number]  Chief Complaint  Patient presents with   Discuss ED    HPI  ED: was placed on lisinopril in the past. After taking the medicatoin he developed ED. States that he was changed to diovan and he started doing ok with his erections.  States that if he starts thinking back to times when he had difficulty with his erections he can lose his current erection starting to affect the sex life between him and his partner.  States that with short periods of time he can lose the erection.  Has no issue obtaining or maintianing erction will wake up with an erection and have them throughout the night. States that he has tried viagra in the past but had an adverse effect of headache every time. States he is not sure of the dosage.  This was years ago when he was still maintained on ACE inhibitor.    Review of Systems  Constitutional:  Negative for chills and fever.  Genitourinary:        Negative for difficulty obtaining erection, painful erection, painful ejaculation     Objective:     BP 128/88   Pulse 68   Temp (!) 97.1 F (36.2 C)   Resp 14   Ht '5\' 11"'$  (1.803 m)   Wt 223 lb 8 oz (101.4 kg)   SpO2 96%   BMI 31.17 kg/m    Physical Exam Vitals and nursing note reviewed.  Constitutional:      Appearance: Normal appearance.  Cardiovascular:     Rate and Rhythm: Normal rate and regular rhythm.  Pulmonary:     Effort: Pulmonary effort is normal.     Breath sounds: Normal breath sounds.  Neurological:     Mental Status: He is alert.     No results found for any visits on 10/05/21.    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  We will trial patient on tadalafil 10 mg as needed as needed 30 minutes prior to sexual intercourse.  Did discuss possible side effects and traditionally insurance only covers 10 or so tablets a month.  Patient acknowledged and would like to try medication prescription sent in for same       Relevant Medications   tadalafil (CIALIS) 10 MG tablet    No follow-ups on file.    Romilda Garret, NP

## 2021-10-05 NOTE — Assessment & Plan Note (Signed)
Patient's erectile dysfunction seems more  psychological versus physiological.  We will trial patient on tadalafil 10 mg as needed as needed 30 minutes prior to sexual intercourse.  Did discuss possible side effects and traditionally insurance only covers 10 or so tablets a month.  Patient acknowledged and would like to try medication prescription sent in for same

## 2021-10-23 ENCOUNTER — Other Ambulatory Visit: Payer: Self-pay

## 2021-10-23 ENCOUNTER — Emergency Department: Payer: 59

## 2021-10-23 ENCOUNTER — Encounter: Payer: Self-pay | Admitting: Emergency Medicine

## 2021-10-23 ENCOUNTER — Inpatient Hospital Stay
Admission: EM | Admit: 2021-10-23 | Discharge: 2021-10-26 | DRG: 419 | Disposition: A | Discharge status: Home / Self Care | Payer: 59 | Attending: Surgery | Admitting: Surgery

## 2021-10-23 ENCOUNTER — Ambulatory Visit: Admit: 2021-10-23 | Payer: 59

## 2021-10-23 DIAGNOSIS — I10 Essential (primary) hypertension: Secondary | ICD-10-CM | POA: Diagnosis present

## 2021-10-23 DIAGNOSIS — Z87891 Personal history of nicotine dependence: Secondary | ICD-10-CM | POA: Diagnosis not present

## 2021-10-23 DIAGNOSIS — K810 Acute cholecystitis: Secondary | ICD-10-CM

## 2021-10-23 DIAGNOSIS — K5730 Diverticulosis of large intestine without perforation or abscess without bleeding: Secondary | ICD-10-CM | POA: Diagnosis present

## 2021-10-23 DIAGNOSIS — Z79899 Other long term (current) drug therapy: Secondary | ICD-10-CM

## 2021-10-23 DIAGNOSIS — R109 Unspecified abdominal pain: Secondary | ICD-10-CM | POA: Diagnosis present

## 2021-10-23 DIAGNOSIS — Z833 Family history of diabetes mellitus: Secondary | ICD-10-CM

## 2021-10-23 DIAGNOSIS — K760 Fatty (change of) liver, not elsewhere classified: Secondary | ICD-10-CM | POA: Diagnosis present

## 2021-10-23 DIAGNOSIS — K8000 Calculus of gallbladder with acute cholecystitis without obstruction: Secondary | ICD-10-CM | POA: Diagnosis present

## 2021-10-23 DIAGNOSIS — E119 Type 2 diabetes mellitus without complications: Secondary | ICD-10-CM | POA: Diagnosis present

## 2021-10-23 DIAGNOSIS — Z8249 Family history of ischemic heart disease and other diseases of the circulatory system: Secondary | ICD-10-CM | POA: Diagnosis not present

## 2021-10-23 DIAGNOSIS — Z809 Family history of malignant neoplasm, unspecified: Secondary | ICD-10-CM

## 2021-10-23 DIAGNOSIS — K660 Peritoneal adhesions (postprocedural) (postinfection): Secondary | ICD-10-CM | POA: Diagnosis present

## 2021-10-23 DIAGNOSIS — K819 Cholecystitis, unspecified: Principal | ICD-10-CM

## 2021-10-23 DIAGNOSIS — K828 Other specified diseases of gallbladder: Secondary | ICD-10-CM | POA: Diagnosis present

## 2021-10-23 DIAGNOSIS — E785 Hyperlipidemia, unspecified: Secondary | ICD-10-CM | POA: Diagnosis present

## 2021-10-23 LAB — LIPASE, BLOOD: Lipase: 31 U/L (ref 11–51)

## 2021-10-23 LAB — COMPREHENSIVE METABOLIC PANEL
ALT: 24 U/L (ref 0–44)
AST: 17 U/L (ref 15–41)
Albumin: 4.1 g/dL (ref 3.5–5.0)
Alkaline Phosphatase: 49 U/L (ref 38–126)
Anion gap: 8 (ref 5–15)
BUN: 16 mg/dL (ref 8–23)
CO2: 28 mmol/L (ref 22–32)
Calcium: 9.4 mg/dL (ref 8.9–10.3)
Chloride: 101 mmol/L (ref 98–111)
Creatinine, Ser: 0.94 mg/dL (ref 0.61–1.24)
GFR, Estimated: >60 mL/min (ref >60)
Glucose, Bld: 125 mg/dL — ABNORMAL HIGH (ref 70–99)
Potassium: 3.5 mmol/L (ref 3.5–5.1)
Sodium: 137 mmol/L (ref 135–145)
Total Bilirubin: 1.3 mg/dL — ABNORMAL HIGH (ref 0.3–1.2)
Total Protein: 8.8 g/dL — ABNORMAL HIGH (ref 6.5–8.1)

## 2021-10-23 LAB — HEPATIC FUNCTION PANEL
ALT: 25 U/L (ref 0–44)
AST: 20 U/L (ref 15–41)
Albumin: 4 g/dL (ref 3.5–5.0)
Alkaline Phosphatase: 47 U/L (ref 38–126)
Bilirubin, Direct: 0.2 mg/dL (ref 0.0–0.2)
Indirect Bilirubin: 1.2 mg/dL — ABNORMAL HIGH (ref 0.3–0.9)
Total Bilirubin: 1.4 mg/dL — ABNORMAL HIGH (ref 0.3–1.2)
Total Protein: 8.4 g/dL — ABNORMAL HIGH (ref 6.5–8.1)

## 2021-10-23 LAB — CBC
HCT: 43.8 % (ref 39.0–52.0)
Hemoglobin: 15 g/dL (ref 13.0–17.0)
MCH: 32.1 pg (ref 26.0–34.0)
MCHC: 34.2 g/dL (ref 30.0–36.0)
MCV: 93.8 fL (ref 80.0–100.0)
Platelets: 210 10*3/uL (ref 150–400)
RBC: 4.67 MIL/uL (ref 4.22–5.81)
RDW: 11.9 % (ref 11.5–15.5)
WBC: 9.6 10*3/uL (ref 4.0–10.5)
nRBC: 0 % (ref 0.0–0.2)

## 2021-10-23 LAB — URINALYSIS, ROUTINE W REFLEX MICROSCOPIC
Bilirubin Urine: NEGATIVE
Glucose, UA: NEGATIVE mg/dL
Hgb urine dipstick: NEGATIVE
Ketones, ur: 5 mg/dL — AB
Leukocytes,Ua: NEGATIVE
Nitrite: NEGATIVE
Protein, ur: NEGATIVE mg/dL
Specific Gravity, Urine: 1.018 (ref 1.005–1.030)
pH: 6 (ref 5.0–8.0)

## 2021-10-23 MED ORDER — IOHEXOL 300 MG/ML  SOLN
100.0000 mL | Freq: Once | INTRAMUSCULAR | Status: AC | PRN
Start: 2021-10-23 — End: 2021-10-23
  Administered 2021-10-23: 100 mL via INTRAVENOUS

## 2021-10-23 NOTE — ED Triage Notes (Signed)
Pt here with a fever and abd pain for the past week intermittently. Pt states his stool is yellow as well. Pt has a hx of gall stones.

## 2021-10-23 NOTE — ED Triage Notes (Signed)
C/O fever intermittently x 5 days.  Denies N/V.  ALso states stools are yellow since Friday.  Describes vague c/o upper abdominal pain.  Also decreased appetite x 5 days.  AAOx3.  Skin warm and dry. NAD

## 2021-10-23 NOTE — H&P (View-Only) (Signed)
Subjective:   CC: acute cholecystitis  HPI:  Steven Ramirez is a 61 y.o. male who is consulted by [facility] for evaluation of above cc.  Symptoms were first noted a few days ago. Pain is sharp,  RUQ, radiating to back, woke from sleep. Associated with yellow color stool, feeling fevers. Exacerbated by nothing specific.     Past Medical History:  has a past medical history of Allergy (1982), Diabetes mellitus without complication ([place]) (7824), Hyperlipidemia, and Hypertension (1995).  Past Surgical History:  has a past surgical history that includes Vasectomy; Spine surgery; Hemorrhoid surgery; Anal fissurectomy; Back surgery; and Colonoscopy with propofol (N/A, 09/29/2021).  Family History: family history includes Cancer in his paternal grandmother; Crohn's disease in his mother; Diabetes in his brother and brother; Gout in his father; Hypertension in his father; Hypothyroidism in his daughter and mother.  Social History:  reports that he quit smoking about 13 years ago. His smoking use included cigars. He has never used smokeless tobacco. He reports current alcohol use of about 21.0 standard drinks of alcohol per week. He reports that he does not use drugs.  Current Medications:  Prior to Admission medications   Medication Sig Start Date End Date Taking? Authorizing Provider  atorvastatin (LIPITOR) 20 MG tablet Take 1 tablet (20 mg total) by mouth daily. 08/14/21   Michela Pitcher, NP  cetirizine (ZYRTEC) 10 MG tablet  07/08/11   [provider]  tadalafil (CIALIS) 10 MG tablet Take 1 tablet by mouth 30 mins prior to sexual intercourse as needed 10/05/21   Michela Pitcher, NP  valsartan-hydrochlorothiazide (DIOVAN HCT) 320-12.5 MG tablet Take 1 tablet by mouth daily. 08/14/21   Michela Pitcher, NP    Allergies:  Allergies as of 10/23/2021 - Review Complete 10/23/2021  Allergen Reaction Noted   Peach [prunus persica]  09/30/2020    ROS:  General: Denies weight loss, weight gain,  fatigue, fevers, chills, and night sweats. Eyes: Denies blurry vision, double vision, eye pain, itchy eyes, and tearing. Ears: Denies hearing loss, earache, and ringing in ears. Nose: Denies sinus pain, congestion, infections, runny nose, and nosebleeds. Mouth/throat: Denies hoarseness, sore throat, bleeding gums, and difficulty swallowing. Heart: Denies chest pain, palpitations, racing heart, irregular heartbeat, leg pain or swelling, and decreased activity tolerance. Respiratory: Denies breathing difficulty, shortness of breath, wheezing, cough, and sputum. GI: Denies change in appetite, heartburn, nausea, vomiting, constipation, diarrhea, and blood in stool. GU: Denies difficulty urinating, pain with urinating, urgency, frequency, blood in urine. Musculoskeletal: Denies joint stiffness, pain, swelling, muscle weakness. Skin: Denies rash, itching, mass, tumors, sores, and boils Neurologic: Denies headache, fainting, dizziness, seizures, numbness, and tingling. Psychiatric: Denies depression, anxiety, difficulty sleeping, and memory loss. Endocrine: Denies heat or cold intolerance, and increased thirst or urination. Blood/lymph: Denies easy bruising, and swollen glands     Objective:     BP (!) 148/96 (BP Location: Left Arm)   Pulse 60   Temp 98.4 F (36.9 C) (Oral)   Resp 16   Ht '5\' 11"'$  (1.803 m)   Wt 99.8 kg   SpO2 96%   BMI 30.68 kg/m    Constitutional :  alert, cooperative, appears stated age, and no distress  Lymphatics/Throat:  no asymmetry, masses, or scars  Respiratory:  clear to auscultation bilaterally  Cardiovascular:  regular rate and rhythm  Gastrointestinal: Soft, minor TTP in RUQ .   Musculoskeletal: Steady movement  Skin: Cool and moist  Psychiatric: Normal affect, non-agitated, not confused  LABS:     Latest Ref Rng & Units 10/23/2021   11:40 AM 08/14/2021   12:29 PM 10/21/2020    8:35 AM  CMP  Glucose 70 - 99 mg/dL 125  117  118   BUN 8 - 23  mg/dL '16  21  20   '$ Creatinine 0.61 - 1.24 mg/dL 0.94  0.91  1.10   Sodium 135 - 145 mmol/L 137  138  141   Potassium 3.5 - 5.1 mmol/L 3.5  4.3  4.1   Chloride 98 - 111 mmol/L 101  101  101   CO2 22 - 32 mmol/L 28  32  25   Calcium 8.9 - 10.3 mg/dL 9.4  10.3  10.0   Total Protein 6.5 - 8.1 g/dL 8.8  7.5  7.1   Total Bilirubin 0.3 - 1.2 mg/dL 1.3  0.7  0.8   Alkaline Phos 38 - 126 U/L 49  37  43   AST 15 - 41 U/L '17  27  26   '$ ALT 0 - 44 U/L 24  48  50       Latest Ref Rng & Units 10/23/2021   11:40 AM 08/14/2021   12:29 PM 10/21/2020    8:35 AM  CBC  WBC 4.0 - 10.5 K/uL 9.6  4.8  4.5   Hemoglobin 13.0 - 17.0 g/dL 15.0  15.1  14.6   Hematocrit 39.0 - 52.0 % 43.8  43.6  42.7   Platelets 150 - 400 K/uL 210  184.0  154      RADS: CLINICAL DATA:  Abdominal pain.   EXAM: CT ABDOMEN AND PELVIS WITH CONTRAST   TECHNIQUE: Multidetector CT imaging of the abdomen and pelvis was performed using the standard protocol following bolus administration of intravenous contrast.   RADIATION DOSE REDUCTION: This exam was performed according to the departmental dose-optimization program which includes automated exposure control, adjustment of the mA and/or kV according to patient size and/or use of iterative reconstruction technique.   CONTRAST:  19m OMNIPAQUE IOHEXOL 300 MG/ML  SOLN   COMPARISON:  None Available.   FINDINGS: Lower chest: The visualized lung bases are clear.   No intra-abdominal free air or free fluid.   Hepatobiliary: Severe fatty liver. No intrahepatic biliary dilatation. Multiple gallstones. There is a stone in the neck of the gallbladder. There is haziness of the gallbladder wall and pericholecystic fat concerning for acute cholecystitis. Further evaluation with right upper quadrant ultrasound recommended.   Pancreas: Unremarkable. No pancreatic ductal dilatation or surrounding inflammatory changes.   Spleen: Several small calcified splenic granuloma. The spleen  is otherwise unremarkable.   Adrenals/Urinary Tract: The adrenal glands unremarkable. There is no hydronephrosis on either side. There is symmetric enhancement and excretion of contrast by both kidneys. Subcentimeter bilateral renal hypodense lesions are too small to characterize. The visualized ureters and urinary bladder appear unremarkable.   Stomach/Bowel: There is sigmoid diverticulosis. Minimal haziness adjacent to a sigmoid diverticula in the anterior lower abdomen (73/2 and coronal 25/5), likely chronic. Mild acute diverticulitis is less likely. There is no bowel obstruction. The appendix is normal.   Vascular/Lymphatic: The abdominal aorta and IVC are unremarkable. No portal venous gas. There is no adenopathy.   Reproductive: The prostate and seminal vesicles are grossly unremarkable. No pelvic mass.   Other: None   Musculoskeletal: Mild degenerative changes of the spine. No acute osseous pathology.   IMPRESSION: 1. Cholelithiasis with findings concerning for acute cholecystitis. Further evaluation with right upper quadrant ultrasound  recommended. 2. Sigmoid diverticulosis. No bowel obstruction. Normal appendix. 3. Severe fatty liver.     Electronically Signed   By: Anner Crete M.D.   On: 10/23/2021 19:56   Assessment:      Acute cholecystitis  Plan:      Discussed the risk of surgery including post-op infxn, seroma, biloma, chronic pain, poor-delayed wound healing, retained gallstone, conversion to open procedure, post-op SBO or ileus, and need for additional procedures to address said risks.  The risks of general anesthetic including MI, CVA, sudden death or even reaction to anesthetic medications also discussed. Alternatives include continued observation.  Benefits include possible symptom relief, prevention of complications including acute cholecystitis, pancreatitis.  Typical post operative recovery of 3-5 days rest, continued pain in area and  incision sites, possible loose stools up to 4-6 weeks, also discussed.  The patient understands the risks, any and all questions were answered to the patient's satisfaction.  To OR for robo lap chole, IV abx, NPO in the meantime  labs/images/medications/previous chart entries reviewed personally and relevant changes/updates noted above.

## 2021-10-23 NOTE — ED Provider Notes (Signed)
[facility] Provider Note  Patient Contact: 5:23 PM (approximate)   History   Abdominal Pain   HPI  Steven Ramirez is a 61 y.o. male who presents the emergency department complaining of epigastric/right upper quadrant pain with greasy and yellowish stools.  Patient states that he was out of town, he did eat a seafood platter prior to the onset of symptoms.  Patient has been having right upper quadrant abdominal and epigastric pain.  Intermittent fevers are reported at home though patient did arrive afebrile here.  Does have a history of gallstones in the past.  Still has his gallbladder.  No history of other GI abnormality such as history of obstructions, colitis, IBS, Crohn's.  He did have some URI symptoms such as cough for few days after developing abdominal pain but these symptoms have resolved.  Patient is still experiencing his epigastric/right upper quadrant pain.  No blood in the stools.  No urinary changes.  No hematuria     Physical Exam   Triage Vital Signs: ED Triage Vitals  Enc Vitals Group     BP 10/23/21 1144 (!) 136/98     Pulse Rate 10/23/21 1144 73     Resp 10/23/21 1144 18     Temp 10/23/21 1144 98.4 F (36.9 C)     Temp Source 10/23/21 1144 Oral     SpO2 10/23/21 1144 96 %     Weight 10/23/21 1139 220 lb (99.8 kg)     Height 10/23/21 1139 '5\' 11"'$  (1.803 m)     Head Circumference --      Peak Flow --      Pain Score 10/23/21 1138 1     Pain Loc --      Pain Edu? --      Excl. in [place]? --     Most recent vital signs: Vitals:   10/23/21 1621 10/23/21 1821  BP: (!) 153/101 (!) 148/96  Pulse: (!) 57 60  Resp: 16 16  Temp:    SpO2: 94% 96%     General: Alert and in no acute distress.  Cardiovascular:  Good peripheral perfusion Respiratory: Normal respiratory effort without tachypnea or retractions. Lungs CTAB. Good air entry to the bases with no decreased or absent breath sounds Gastrointestinal: Bowel sounds 4 quadrants.  Soft  to palpation.  Slight tenderness in the epigastric/right upper quadrant.. No guarding or rigidity. No palpable masses. No distention. No CVA tenderness. Musculoskeletal: Full range of motion to all extremities.  Neurologic:  No gross focal neurologic deficits are appreciated.  Skin:   No rash noted Other:   ED Results / Procedures / Treatments   Labs (all labs ordered are listed, but only abnormal results are displayed) Labs Reviewed  COMPREHENSIVE METABOLIC PANEL - Abnormal; Notable for the following components:      Result Value   Glucose, Bld 125 (*)    Total Protein 8.8 (*)    Total Bilirubin 1.3 (*)    All other components within normal limits  URINALYSIS, ROUTINE W REFLEX MICROSCOPIC - Abnormal; Notable for the following components:   Color, Urine YELLOW (*)    APPearance CLEAR (*)    Ketones, ur 5 (*)    All other components within normal limits  HEPATIC FUNCTION PANEL - Abnormal; Notable for the following components:   Total Protein 8.4 (*)    Total Bilirubin 1.4 (*)    Indirect Bilirubin 1.2 (*)    All other components within normal limits  LIPASE, BLOOD  CBC     EKG     RADIOLOGY  I personally viewed, evaluated, and interpreted these images as part of my medical decision making, as well as reviewing the written report by the radiologist.  ED Provider Interpretation: Findings on CT are consistent with cholecystitis with cholelithiasis.  There is a stone at the common bile duct with no evidence at this time of common bile duct dilation.  Diverticulosis without diverticulitis.  CT ABDOMEN PELVIS W CONTRAST  Result Date: 10/23/2021 CLINICAL DATA:  Abdominal pain. EXAM: CT ABDOMEN AND PELVIS WITH CONTRAST TECHNIQUE: Multidetector CT imaging of the abdomen and pelvis was performed using the standard protocol following bolus administration of intravenous contrast. RADIATION DOSE REDUCTION: This exam was performed according to the departmental dose-optimization program  which includes automated exposure control, adjustment of the mA and/or kV according to patient size and/or use of iterative reconstruction technique. CONTRAST:  175m OMNIPAQUE IOHEXOL 300 MG/ML  SOLN COMPARISON:  None Available. FINDINGS: Lower chest: The visualized lung bases are clear. No intra-abdominal free air or free fluid. Hepatobiliary: Severe fatty liver. No intrahepatic biliary dilatation. Multiple gallstones. There is a stone in the neck of the gallbladder. There is haziness of the gallbladder wall and pericholecystic fat concerning for acute cholecystitis. Further evaluation with right upper quadrant ultrasound recommended. Pancreas: Unremarkable. No pancreatic ductal dilatation or surrounding inflammatory changes. Spleen: Several small calcified splenic granuloma. The spleen is otherwise unremarkable. Adrenals/Urinary Tract: The adrenal glands unremarkable. There is no hydronephrosis on either side. There is symmetric enhancement and excretion of contrast by both kidneys. Subcentimeter bilateral renal hypodense lesions are too small to characterize. The visualized ureters and urinary bladder appear unremarkable. Stomach/Bowel: There is sigmoid diverticulosis. Minimal haziness adjacent to a sigmoid diverticula in the anterior lower abdomen (73/2 and coronal 25/5), likely chronic. Mild acute diverticulitis is less likely. There is no bowel obstruction. The appendix is normal. Vascular/Lymphatic: The abdominal aorta and IVC are unremarkable. No portal venous gas. There is no adenopathy. Reproductive: The prostate and seminal vesicles are grossly unremarkable. No pelvic mass. Other: None Musculoskeletal: Mild degenerative changes of the spine. No acute osseous pathology. IMPRESSION: 1. Cholelithiasis with findings concerning for acute cholecystitis. Further evaluation with right upper quadrant ultrasound recommended. 2. Sigmoid diverticulosis. No bowel obstruction. Normal appendix. 3. Severe fatty liver.  Electronically Signed   By: AAnner CreteM.D.   On: 10/23/2021 19:56    PROCEDURES:  Critical Care performed: No  Procedures   MEDICATIONS ORDERED IN ED: Medications  iohexol (OMNIPAQUE) 300 MG/ML solution 100 mL (100 mLs Intravenous Contrast Given 10/23/21 1929)     IMPRESSION / MDM / ASSESSMENT AND PLAN / ED COURSE  I reviewed the triage vital signs and the nursing notes.                              Differential diagnosis includes, but is not limited to, cholecystitis, cholelithiasis, biliary colic, colitis, celiac's, IBS, Crohn's  Patient's presentation is most consistent with acute complicated illness / injury requiring diagnostic workup.   Patient's diagnosis is consistent with cholecystitis.  Patient presents emergency department with right upper quadrant and epigastric pain for the last 5 days.  The symptoms did start after eating some greasy seafood.  Patient states that he is having some ongoing pain, some diarrhea that is described as a yellowish color.  No blood in the diarrhea.  Patient has had some intermittent fevers over the  past 5 days but did arrive afebrile here.  Tender in the right upper quadrant with some guarding.  Positive Murphy sign.  Patient's labs were reassuring, slight bump in his T. bili but otherwise no elevation in LFTs.  Patient did have findings consistent with cholelithiasis and cholecystitis on his CT.  Discussed the patient with on-call surgeon, Dr. Lysle Pearl who agrees to admit the patient for cholecystectomy tomorrow.Marland Kitchen         FINAL CLINICAL IMPRESSION(S) / ED DIAGNOSES   Final diagnoses:  Cholecystitis     Rx / DC Orders   ED Discharge Orders     None        Note:  This document was prepared using Dragon voice recognition software and may include unintentional dictation errors.   Brynda Peon 10/23/21 2118    Delman Kitten, MD 10/23/21 2251

## 2021-10-23 NOTE — Progress Notes (Signed)
Subjective:   CC: acute cholecystitis  HPI:  Steven Ramirez is a 61 y.o. male who is consulted by C[facility]riell for evaluation of above cc.  Symptoms were first noted a few days ago. Pain is sharp,  RUQ, radiating to back, woke from sleep. Associated with yellow color stool, feeling fevers. Exacerbated by nothing specific.     Past Medical History:  has a past medical history of Allergy (1982), Diabetes mellitus without complication ([place]) (4696), Hyperlipidemia, and Hypertension (1995).  Past Surgical History:  has a past surgical history that includes Vasectomy; Spine surgery; Hemorrhoid surgery; Anal fissurectomy; Back surgery; and Colonoscopy with propofol (N/A, 09/29/2021).  Family History: family history includes Cancer in his paternal grandmother; Crohn's disease in his mother; Diabetes in his brother and brother; Gout in his father; Hypertension in his father; Hypothyroidism in his daughter and mother.  Social History:  reports that he quit smoking about 13 years ago. His smoking use included cigars. He has never used smokeless tobacco. He reports current alcohol use of about 21.0 standard drinks of alcohol per week. He reports that he does not use drugs.  Current Medications:  Prior to Admission medications   Medication Sig Start Date End Date Taking? Authorizing Provider  atorvastatin (LIPITOR) 20 MG tablet Take 1 tablet (20 mg total) by mouth daily. 08/14/21   Michela Pitcher, NP  cetirizine (ZYRTEC) 10 MG tablet  07/08/11   [provider]  tadalafil (CIALIS) 10 MG tablet Take 1 tablet by mouth 30 mins prior to sexual intercourse as needed 10/05/21   Michela Pitcher, NP  valsartan-hydrochlorothiazide (DIOVAN HCT) 320-12.5 MG tablet Take 1 tablet by mouth daily. 08/14/21   Michela Pitcher, NP    Allergies:  Allergies as of 10/23/2021 - Review Complete 10/23/2021  Allergen Reaction Noted   Peach [prunus persica]  09/30/2020    ROS:  General: Denies weight loss, weight gain,  fatigue, fevers, chills, and night sweats. Eyes: Denies blurry vision, double vision, eye pain, itchy eyes, and tearing. Ears: Denies hearing loss, earache, and ringing in ears. Nose: Denies sinus pain, congestion, infections, runny nose, and nosebleeds. Mouth/throat: Denies hoarseness, sore throat, bleeding gums, and difficulty swallowing. Heart: Denies chest pain, palpitations, racing heart, irregular heartbeat, leg pain or swelling, and decreased activity tolerance. Respiratory: Denies breathing difficulty, shortness of breath, wheezing, cough, and sputum. GI: Denies change in appetite, heartburn, nausea, vomiting, constipation, diarrhea, and blood in stool. GU: Denies difficulty urinating, pain with urinating, urgency, frequency, blood in urine. Musculoskeletal: Denies joint stiffness, pain, swelling, muscle weakness. Skin: Denies rash, itching, mass, tumors, sores, and boils Neurologic: Denies headache, fainting, dizziness, seizures, numbness, and tingling. Psychiatric: Denies depression, anxiety, difficulty sleeping, and memory loss. Endocrine: Denies heat or cold intolerance, and increased thirst or urination. Blood/lymph: Denies easy bruising, and swollen glands     Objective:     BP (!) 148/96 (BP Location: Left Arm)   Pulse 60   Temp 98.4 F (36.9 C) (Oral)   Resp 16   Ht '5\' 11"'$  (1.803 m)   Wt 99.8 kg   SpO2 96%   BMI 30.68 kg/m    Constitutional :  alert, cooperative, appears stated age, and no distress  Lymphatics/Throat:  no asymmetry, masses, or scars  Respiratory:  clear to auscultation bilaterally  Cardiovascular:  regular rate and rhythm  Gastrointestinal: Soft, minor TTP in RUQ .   Musculoskeletal: Steady movement  Skin: Cool and moist  Psychiatric: Normal affect, non-agitated, not confused  LABS:     Latest Ref Rng & Units 10/23/2021   11:40 AM 08/14/2021   12:29 PM 10/21/2020    8:35 AM  CMP  Glucose 70 - 99 mg/dL 125  117  118   BUN 8 - 23  mg/dL '16  21  20   '$ Creatinine 0.61 - 1.24 mg/dL 0.94  0.91  1.10   Sodium 135 - 145 mmol/L 137  138  141   Potassium 3.5 - 5.1 mmol/L 3.5  4.3  4.1   Chloride 98 - 111 mmol/L 101  101  101   CO2 22 - 32 mmol/L 28  32  25   Calcium 8.9 - 10.3 mg/dL 9.4  10.3  10.0   Total Protein 6.5 - 8.1 g/dL 8.8  7.5  7.1   Total Bilirubin 0.3 - 1.2 mg/dL 1.3  0.7  0.8   Alkaline Phos 38 - 126 U/L 49  37  43   AST 15 - 41 U/L '17  27  26   '$ ALT 0 - 44 U/L 24  48  50       Latest Ref Rng & Units 10/23/2021   11:40 AM 08/14/2021   12:29 PM 10/21/2020    8:35 AM  CBC  WBC 4.0 - 10.5 K/uL 9.6  4.8  4.5   Hemoglobin 13.0 - 17.0 g/dL 15.0  15.1  14.6   Hematocrit 39.0 - 52.0 % 43.8  43.6  42.7   Platelets 150 - 400 K/uL 210  184.0  154      RADS: CLINICAL DATA:  Abdominal pain.   EXAM: CT ABDOMEN AND PELVIS WITH CONTRAST   TECHNIQUE: Multidetector CT imaging of the abdomen and pelvis was performed using the standard protocol following bolus administration of intravenous contrast.   RADIATION DOSE REDUCTION: This exam was performed according to the departmental dose-optimization program which includes automated exposure control, adjustment of the mA and/or kV according to patient size and/or use of iterative reconstruction technique.   CONTRAST:  169m OMNIPAQUE IOHEXOL 300 MG/ML  SOLN   COMPARISON:  None Available.   FINDINGS: Lower chest: The visualized lung bases are clear.   No intra-abdominal free air or free fluid.   Hepatobiliary: Severe fatty liver. No intrahepatic biliary dilatation. Multiple gallstones. There is a stone in the neck of the gallbladder. There is haziness of the gallbladder wall and pericholecystic fat concerning for acute cholecystitis. Further evaluation with right upper quadrant ultrasound recommended.   Pancreas: Unremarkable. No pancreatic ductal dilatation or surrounding inflammatory changes.   Spleen: Several small calcified splenic granuloma. The spleen  is otherwise unremarkable.   Adrenals/Urinary Tract: The adrenal glands unremarkable. There is no hydronephrosis on either side. There is symmetric enhancement and excretion of contrast by both kidneys. Subcentimeter bilateral renal hypodense lesions are too small to characterize. The visualized ureters and urinary bladder appear unremarkable.   Stomach/Bowel: There is sigmoid diverticulosis. Minimal haziness adjacent to a sigmoid diverticula in the anterior lower abdomen (73/2 and coronal 25/5), likely chronic. Mild acute diverticulitis is less likely. There is no bowel obstruction. The appendix is normal.   Vascular/Lymphatic: The abdominal aorta and IVC are unremarkable. No portal venous gas. There is no adenopathy.   Reproductive: The prostate and seminal vesicles are grossly unremarkable. No pelvic mass.   Other: None   Musculoskeletal: Mild degenerative changes of the spine. No acute osseous pathology.   IMPRESSION: 1. Cholelithiasis with findings concerning for acute cholecystitis. Further evaluation with right upper quadrant ultrasound  recommended. 2. Sigmoid diverticulosis. No bowel obstruction. Normal appendix. 3. Severe fatty liver.     Electronically Signed   By: Anner Crete M.D.   On: 10/23/2021 19:56   Assessment:      Acute cholecystitis  Plan:      Discussed the risk of surgery including post-op infxn, seroma, biloma, chronic pain, poor-delayed wound healing, retained gallstone, conversion to open procedure, post-op SBO or ileus, and need for additional procedures to address said risks.  The risks of general anesthetic including MI, CVA, sudden death or even reaction to anesthetic medications also discussed. Alternatives include continued observation.  Benefits include possible symptom relief, prevention of complications including acute cholecystitis, pancreatitis.  Typical post operative recovery of 3-5 days rest, continued pain in area and  incision sites, possible loose stools up to 4-6 weeks, also discussed.  The patient understands the risks, any and all questions were answered to the patient's satisfaction.  To OR for robo lap chole, IV abx, NPO in the meantime  labs/images/medications/previous chart entries reviewed personally and relevant changes/updates noted above.

## 2021-10-24 ENCOUNTER — Encounter: Payer: Self-pay | Admitting: Surgery

## 2021-10-24 ENCOUNTER — Other Ambulatory Visit: Payer: Self-pay

## 2021-10-24 ENCOUNTER — Inpatient Hospital Stay: Payer: 59 | Admitting: Registered Nurse

## 2021-10-24 ENCOUNTER — Encounter
Admission: EM | Disposition: A | Discharge status: Home / Self Care | Payer: Self-pay | Source: Home / Self Care | Attending: Surgery

## 2021-10-24 LAB — SURGICAL PCR SCREEN
MRSA, PCR: NEGATIVE
Staphylococcus aureus: POSITIVE — AB

## 2021-10-24 LAB — GLUCOSE, CAPILLARY
Glucose-Capillary: 113 mg/dL — ABNORMAL HIGH (ref 70–99)
Glucose-Capillary: 136 mg/dL — ABNORMAL HIGH (ref 70–99)
Glucose-Capillary: 146 mg/dL — ABNORMAL HIGH (ref 70–99)

## 2021-10-24 LAB — HEPATIC FUNCTION PANEL
ALT: 25 U/L (ref 0–44)
AST: 18 U/L (ref 15–41)
Albumin: 3.7 g/dL (ref 3.5–5.0)
Alkaline Phosphatase: 51 U/L (ref 38–126)
Bilirubin, Direct: 0.2 mg/dL (ref 0.0–0.2)
Indirect Bilirubin: 1.1 mg/dL — ABNORMAL HIGH (ref 0.3–0.9)
Total Bilirubin: 1.3 mg/dL — ABNORMAL HIGH (ref 0.3–1.2)
Total Protein: 8 g/dL (ref 6.5–8.1)

## 2021-10-24 LAB — BASIC METABOLIC PANEL
Anion gap: 7 (ref 5–15)
BUN: 15 mg/dL (ref 8–23)
CO2: 30 mmol/L (ref 22–32)
Calcium: 9 mg/dL (ref 8.9–10.3)
Chloride: 99 mmol/L (ref 98–111)
Creatinine, Ser: 1.05 mg/dL (ref 0.61–1.24)
GFR, Estimated: >60 mL/min (ref >60)
Glucose, Bld: 119 mg/dL — ABNORMAL HIGH (ref 70–99)
Potassium: 3.8 mmol/L (ref 3.5–5.1)
Sodium: 136 mmol/L (ref 135–145)

## 2021-10-24 LAB — CBC
HCT: 40.7 % (ref 39.0–52.0)
Hemoglobin: 13.9 g/dL (ref 13.0–17.0)
MCH: 32.4 pg (ref 26.0–34.0)
MCHC: 34.2 g/dL (ref 30.0–36.0)
MCV: 94.9 fL (ref 80.0–100.0)
Platelets: 219 10*3/uL (ref 150–400)
RBC: 4.29 MIL/uL (ref 4.22–5.81)
RDW: 11.8 % (ref 11.5–15.5)
WBC: 10.6 10*3/uL — ABNORMAL HIGH (ref 4.0–10.5)
nRBC: 0 % (ref 0.0–0.2)

## 2021-10-24 LAB — HIV ANTIBODY (ROUTINE TESTING W REFLEX): HIV Screen 4th Generation wRfx: NONREACTIVE

## 2021-10-24 SURGERY — CHOLECYSTECTOMY, ROBOT-ASSISTED, LAPAROSCOPIC
Anesthesia: General

## 2021-10-24 MED ORDER — SODIUM CHLORIDE 0.9 % IR SOLN
Status: DC | PRN
  Administered 2021-10-24: 400 mL

## 2021-10-24 MED ORDER — LORATADINE 10 MG PO TABS
10.0000 mg | ORAL_TABLET | Freq: Every day | ORAL | Status: DC
  Administered 2021-10-25 – 2021-10-26 (×2): 10 mg via ORAL
  Filled 2021-10-24 (×2): qty 1

## 2021-10-24 MED ORDER — ONDANSETRON HCL 4 MG/2ML IJ SOLN
INTRAMUSCULAR | Status: AC
  Filled 2021-10-24: qty 2

## 2021-10-24 MED ORDER — MUPIROCIN 2 % EX OINT
1.0000 "application " | TOPICAL_OINTMENT | Freq: Two times a day (BID) | CUTANEOUS | Status: DC
  Administered 2021-10-24 – 2021-10-26 (×5): 1 via NASAL

## 2021-10-24 MED ORDER — BUPIVACAINE HCL (PF) 0.5 % IJ SOLN
INTRAMUSCULAR | Status: AC
  Filled 2021-10-24: qty 30

## 2021-10-24 MED ORDER — FENTANYL CITRATE (PF) 100 MCG/2ML IJ SOLN
INTRAMUSCULAR | Status: AC
  Filled 2021-10-24: qty 2

## 2021-10-24 MED ORDER — KETOROLAC TROMETHAMINE 30 MG/ML IJ SOLN
INTRAMUSCULAR | Status: DC | PRN
  Administered 2021-10-24: 30 mg via INTRAVENOUS

## 2021-10-24 MED ORDER — PROPOFOL 10 MG/ML IV BOLUS
INTRAVENOUS | Status: DC | PRN
  Administered 2021-10-24: 120 mg via INTRAVENOUS

## 2021-10-24 MED ORDER — ENOXAPARIN SODIUM 40 MG/0.4ML IJ SOSY
40.0000 mg | PREFILLED_SYRINGE | INTRAMUSCULAR | Status: DC
  Administered 2021-10-25 – 2021-10-26 (×2): 40 mg via SUBCUTANEOUS
  Filled 2021-10-24 (×2): qty 0.4

## 2021-10-24 MED ORDER — MORPHINE SULFATE (PF) 2 MG/ML IV SOLN: 2.0000 mg | INTRAVENOUS | Status: DC | PRN

## 2021-10-24 MED ORDER — SODIUM CHLORIDE 0.9 % IV SOLN: INTRAVENOUS | Status: DC

## 2021-10-24 MED ORDER — FENTANYL CITRATE (PF) 100 MCG/2ML IJ SOLN: 25.0000 ug | INTRAMUSCULAR | Status: DC | PRN

## 2021-10-24 MED ORDER — DOCUSATE SODIUM 100 MG PO CAPS: 100.0000 mg | ORAL_CAPSULE | Freq: Two times a day (BID) | ORAL | Status: DC | PRN

## 2021-10-24 MED ORDER — EPHEDRINE SULFATE (PRESSORS) 50 MG/ML IJ SOLN
INTRAMUSCULAR | Status: DC | PRN
  Administered 2021-10-24: 10 mg via INTRAVENOUS
  Administered 2021-10-24: 5 mg via INTRAVENOUS
  Administered 2021-10-24: 10 mg via INTRAVENOUS

## 2021-10-24 MED ORDER — VALSARTAN-HYDROCHLOROTHIAZIDE 320-12.5 MG PO TABS
1.0000 | ORAL_TABLET | Freq: Every day | ORAL | Status: DC
Start: 2021-10-24 — End: 2021-10-24

## 2021-10-24 MED ORDER — MUPIROCIN 2 % EX OINT
1.0000 "application " | TOPICAL_OINTMENT | Freq: Two times a day (BID) | CUTANEOUS | Status: DC
  Filled 2021-10-24: qty 22

## 2021-10-24 MED ORDER — MIDAZOLAM HCL 2 MG/2ML IJ SOLN
INTRAMUSCULAR | Status: DC | PRN
  Administered 2021-10-24: 2 mg via INTRAVENOUS

## 2021-10-24 MED ORDER — CHLORHEXIDINE GLUCONATE CLOTH 2 % EX PADS
6.0000 | MEDICATED_PAD | Freq: Every day | CUTANEOUS | Status: DC
  Administered 2021-10-24 – 2021-10-25 (×2): 6 via TOPICAL

## 2021-10-24 MED ORDER — IRBESARTAN 150 MG PO TABS
300.0000 mg | ORAL_TABLET | Freq: Every day | ORAL | Status: DC
  Administered 2021-10-25 – 2021-10-26 (×2): 300 mg via ORAL
  Filled 2021-10-24 (×2): qty 2

## 2021-10-24 MED ORDER — ACETAMINOPHEN 10 MG/ML IV SOLN
INTRAVENOUS | Status: DC | PRN
  Administered 2021-10-24: 1000 mg via INTRAVENOUS

## 2021-10-24 MED ORDER — SODIUM CHLORIDE 0.9 % IV SOLN
2.0000 g | Freq: Every day | INTRAVENOUS | Status: DC
  Administered 2021-10-24 – 2021-10-26 (×3): 2 g via INTRAVENOUS
  Filled 2021-10-24: qty 20
  Filled 2021-10-24: qty 2
  Filled 2021-10-24: qty 20

## 2021-10-24 MED ORDER — ROCURONIUM BROMIDE 100 MG/10ML IV SOLN
INTRAVENOUS | Status: DC | PRN
  Administered 2021-10-24: 30 mg via INTRAVENOUS
  Administered 2021-10-24: 50 mg via INTRAVENOUS

## 2021-10-24 MED ORDER — LIDOCAINE HCL (CARDIAC) PF 100 MG/5ML IV SOSY
PREFILLED_SYRINGE | INTRAVENOUS | Status: DC | PRN
  Administered 2021-10-24: 100 mg via INTRAVENOUS

## 2021-10-24 MED ORDER — MIDAZOLAM HCL 2 MG/2ML IJ SOLN
INTRAMUSCULAR | Status: AC
  Filled 2021-10-24: qty 2

## 2021-10-24 MED ORDER — LIDOCAINE-EPINEPHRINE 1 %-1:100000 IJ SOLN
INTRAMUSCULAR | Status: DC | PRN
  Administered 2021-10-24: 20 mL

## 2021-10-24 MED ORDER — OXYCODONE HCL 5 MG PO TABS: 5.0000 mg | ORAL_TABLET | Freq: Once | ORAL | Status: DC | PRN

## 2021-10-24 MED ORDER — INDOCYANINE GREEN 25 MG IV SOLR
1.2500 mg | Freq: Once | INTRAVENOUS | Status: AC
  Administered 2021-10-24: 1.25 mg via INTRAVENOUS
  Filled 2021-10-24: qty 0.5

## 2021-10-24 MED ORDER — OXYCODONE HCL 5 MG/5ML PO SOLN: 5.0000 mg | Freq: Once | ORAL | Status: DC | PRN

## 2021-10-24 MED ORDER — SUCCINYLCHOLINE CHLORIDE 200 MG/10ML IV SOSY
PREFILLED_SYRINGE | INTRAVENOUS | Status: AC
  Filled 2021-10-24: qty 10

## 2021-10-24 MED ORDER — SUCCINYLCHOLINE CHLORIDE 200 MG/10ML IV SOSY
PREFILLED_SYRINGE | INTRAVENOUS | Status: DC | PRN
  Administered 2021-10-24: 100 mg via INTRAVENOUS

## 2021-10-24 MED ORDER — LIDOCAINE-EPINEPHRINE 1 %-1:100000 IJ SOLN
INTRAMUSCULAR | Status: AC
  Filled 2021-10-24: qty 1

## 2021-10-24 MED ORDER — SUGAMMADEX SODIUM 200 MG/2ML IV SOLN
INTRAVENOUS | Status: DC | PRN
  Administered 2021-10-24: 200 mg via INTRAVENOUS

## 2021-10-24 MED ORDER — ACETAMINOPHEN 10 MG/ML IV SOLN
INTRAVENOUS | Status: AC
  Filled 2021-10-24: qty 100

## 2021-10-24 MED ORDER — EPHEDRINE 5 MG/ML INJ
INTRAVENOUS | Status: AC
  Filled 2021-10-24: qty 5

## 2021-10-24 MED ORDER — HYDROCHLOROTHIAZIDE 12.5 MG PO TABS
12.5000 mg | ORAL_TABLET | Freq: Every day | ORAL | Status: DC
  Administered 2021-10-25 – 2021-10-26 (×2): 12.5 mg via ORAL
  Filled 2021-10-24 (×2): qty 1

## 2021-10-24 MED ORDER — LIDOCAINE HCL (PF) 2 % IJ SOLN
INTRAMUSCULAR | Status: AC
  Filled 2021-10-24: qty 5

## 2021-10-24 MED ORDER — PANTOPRAZOLE SODIUM 40 MG IV SOLR
40.0000 mg | Freq: Every day | INTRAVENOUS | Status: DC
  Administered 2021-10-24 – 2021-10-25 (×3): 40 mg via INTRAVENOUS
  Filled 2021-10-24 (×3): qty 10

## 2021-10-24 MED ORDER — ATORVASTATIN CALCIUM 20 MG PO TABS
20.0000 mg | ORAL_TABLET | Freq: Every day | ORAL | Status: DC
  Administered 2021-10-25 – 2021-10-26 (×2): 20 mg via ORAL
  Filled 2021-10-24 (×2): qty 1

## 2021-10-24 MED ORDER — PHENYLEPHRINE HCL (PRESSORS) 10 MG/ML IV SOLN
INTRAVENOUS | Status: DC | PRN
  Administered 2021-10-24: 160 ug via INTRAVENOUS
  Administered 2021-10-24: 80 ug via INTRAVENOUS
  Administered 2021-10-24 (×3): 160 ug via INTRAVENOUS

## 2021-10-24 MED ORDER — TRAMADOL HCL 50 MG PO TABS: 50.0000 mg | ORAL_TABLET | Freq: Four times a day (QID) | ORAL | Status: DC | PRN

## 2021-10-24 MED ORDER — PROPOFOL 10 MG/ML IV BOLUS
INTRAVENOUS | Status: AC
  Filled 2021-10-24: qty 20

## 2021-10-24 MED ORDER — ONDANSETRON HCL 4 MG/2ML IJ SOLN
4.0000 mg | Freq: Four times a day (QID) | INTRAMUSCULAR | Status: DC | PRN
  Administered 2021-10-24: 4 mg via INTRAVENOUS

## 2021-10-24 MED ORDER — HYDROCODONE-ACETAMINOPHEN 5-325 MG PO TABS: 1.0000 | ORAL_TABLET | ORAL | Status: DC | PRN

## 2021-10-24 MED ORDER — HYDROMORPHONE HCL 1 MG/ML IJ SOLN
INTRAMUSCULAR | Status: DC | PRN
  Administered 2021-10-24: 1 mg via INTRAVENOUS

## 2021-10-24 MED ORDER — HYDROMORPHONE HCL 1 MG/ML IJ SOLN
INTRAMUSCULAR | Status: AC
  Filled 2021-10-24: qty 1

## 2021-10-24 MED ORDER — FENTANYL CITRATE (PF) 100 MCG/2ML IJ SOLN
INTRAMUSCULAR | Status: DC | PRN
  Administered 2021-10-24 (×2): 50 ug via INTRAVENOUS

## 2021-10-24 MED ORDER — ONDANSETRON 4 MG PO TBDP: 4.0000 mg | ORAL_TABLET | Freq: Four times a day (QID) | ORAL | Status: DC | PRN

## 2021-10-24 SURGICAL SUPPLY — 58 items
ADH SKN CLS APL DERMABOND .7 (GAUZE/BANDAGES/DRESSINGS) ×1
ANCHOR TIS RET SYS 235ML (MISCELLANEOUS) ×2 IMPLANT
BAG TISS RTRVL C235 10X14 (MISCELLANEOUS) ×1
BLADE SURG SZ11 CARB STEEL (BLADE) ×2 IMPLANT
BULB RESERV EVAC DRAIN JP 100C (MISCELLANEOUS) ×1 IMPLANT
CANNULA REDUC XI 12-8 STAPL (CANNULA) ×2
CANNULA REDUCER 12-8 DVNC XI (CANNULA) ×1 IMPLANT
CLIP LIGATING HEMO O LOK GREEN (MISCELLANEOUS) ×2 IMPLANT
DERMABOND ADVANCED (GAUZE/BANDAGES/DRESSINGS) ×1
DERMABOND ADVANCED .7 DNX12 (GAUZE/BANDAGES/DRESSINGS) ×1 IMPLANT
DRAIN JP 15F RND TROCAR (DRAIN) ×1 IMPLANT
DRAPE ARM DVNC X/XI (DISPOSABLE) ×4 IMPLANT
DRAPE COLUMN DVNC XI (DISPOSABLE) ×1 IMPLANT
DRAPE DA VINCI XI ARM (DISPOSABLE) ×8
DRAPE DA VINCI XI COLUMN (DISPOSABLE) ×2
ELECT CAUTERY BLADE 6.4 (BLADE) ×2 IMPLANT
ELECT REM PT RETURN 9FT ADLT (ELECTROSURGICAL) ×2
ELECTRODE REM PT RTRN 9FT ADLT (ELECTROSURGICAL) ×1 IMPLANT
GLOVE BIOGEL PI IND STRL 7.0 (GLOVE) ×2 IMPLANT
GLOVE BIOGEL PI INDICATOR 7.0 (GLOVE) ×2
GLOVE SURG SYN 6.5 ES PF (GLOVE) ×4 IMPLANT
GLOVE SURG SYN 6.5 PF PI (GLOVE) ×2 IMPLANT
GOWN STRL REUS W/ TWL LRG LVL3 (GOWN DISPOSABLE) ×3 IMPLANT
GOWN STRL REUS W/TWL LRG LVL3 (GOWN DISPOSABLE) ×6
GRASPER SUT TROCAR 14GX15 (MISCELLANEOUS) ×1 IMPLANT
IRRIGATOR SUCT 8 DISP DVNC XI (IRRIGATION / IRRIGATOR) IMPLANT
IRRIGATOR SUCTION 8MM XI DISP (IRRIGATION / IRRIGATOR) ×2
IV NS 1000ML (IV SOLUTION) ×2
IV NS 1000ML BAXH (IV SOLUTION) IMPLANT
LABEL OR SOLS (LABEL) ×2 IMPLANT
MANIFOLD NEPTUNE II (INSTRUMENTS) ×2 IMPLANT
NDL INSUFFLATION 14GA 120MM (NEEDLE) ×1 IMPLANT
NEEDLE HYPO 22GX1.5 SAFETY (NEEDLE) ×2 IMPLANT
NEEDLE INSUFFLATION 14GA 120MM (NEEDLE) ×2 IMPLANT
NS IRRIG 500ML POUR BTL (IV SOLUTION) ×2 IMPLANT
OBTURATOR OPTICAL STANDARD 8MM (TROCAR) ×2
OBTURATOR OPTICAL STND 8 DVNC (TROCAR) ×1
OBTURATOR OPTICALSTD 8 DVNC (TROCAR) ×1 IMPLANT
PACK LAP CHOLECYSTECTOMY (MISCELLANEOUS) ×2 IMPLANT
PENCIL ELECTRO HAND CTR (MISCELLANEOUS) ×2 IMPLANT
SEAL CANN UNIV 5-8 DVNC XI (MISCELLANEOUS) ×3 IMPLANT
SEAL XI 5MM-8MM UNIVERSAL (MISCELLANEOUS) ×6
SET TUBE SMOKE EVAC HIGH FLOW (TUBING) ×2 IMPLANT
SOLUTION ELECTROLUBE (MISCELLANEOUS) ×2 IMPLANT
SPIKE FLUID TRANSFER (MISCELLANEOUS) ×4 IMPLANT
SPONGE DRAIN TRACH 4X4 STRL 2S (GAUZE/BANDAGES/DRESSINGS) ×1 IMPLANT
STAPLER CANNULA SEAL DVNC XI (STAPLE) ×1 IMPLANT
STAPLER CANNULA SEAL XI (STAPLE) ×2
SUT ETHILON 2 0 FS 18 (SUTURE) ×1 IMPLANT
SUT ETHILON 3-0 FS-10 30 BLK (SUTURE) ×2
SUT MNCRL 4-0 (SUTURE) ×4
SUT MNCRL 4-0 27XMFL (SUTURE) ×2
SUT VIC AB 3-0 SH 27 (SUTURE) ×2
SUT VIC AB 3-0 SH 27X BRD (SUTURE) IMPLANT
SUT VICRYL 0 AB UR-6 (SUTURE) ×2 IMPLANT
SUTURE EHLN 3-0 FS-10 30 BLK (SUTURE) IMPLANT
SUTURE MNCRL 4-0 27XMF (SUTURE) ×2 IMPLANT
WATER STERILE IRR 500ML POUR (IV SOLUTION) ×2 IMPLANT

## 2021-10-24 NOTE — Plan of Care (Signed)

## 2021-10-24 NOTE — Transfer of Care (Addendum)
Immediate Anesthesia Transfer of Care Note  Patient: Steven Ramirez  Procedure(s) Performed: XI ROBOTIC ASSISTED LAPAROSCOPIC CHOLECYSTECTOMY INDOCYANINE GREEN FLUORESCENCE IMAGING (ICG)  Patient Location: PACU  Anesthesia Type:General  Level of Consciousness: drowsy  Airway & Oxygen Therapy: Patient Spontanous Breathing and Patient connected to face mask oxygen  Post-op Assessment: Report given to RN and Post -op Vital signs reviewed and stable  Post vital signs: Reviewed and stable  Last Vitals:  Vitals Value Taken Time  BP    Temp    Pulse    Resp    SpO2      Last Pain:  Vitals:   10/24/21 1521  TempSrc: Tympanic  PainSc: 0-No pain         Complications: No notable events documented.

## 2021-10-24 NOTE — ED Notes (Signed)
Report messaged to [facility] butler rn floor nurse by Francoise Schaumann charge nurse

## 2021-10-24 NOTE — Op Note (Signed)
Preoperative diagnosis:  acute and cholecystitis  Postoperative diagnosis: acute gangernous cholecystitis  Procedure: Robotic assisted Laparoscopic Cholecystectomy.   Anesthesia: GETA   Surgeon: Benjamine Sprague  Specimen: Gallbladder  Complications: None  EBL: 91m  Wound Classification: Clean Contaminated  Indications: see HPI  Findings: Critical view of safety noted Cystic duct and artery identified, ligated and divided, clips remained intact at end of procedure Adequate hemostasis  Description of procedure:  The patient was placed on the operating table in the supine position. SCDs placed, pre-op abx administered.  General anesthesia was induced and OG tube placed by anesthesia. A time-out was completed verifying correct patient, procedure, site, positioning, and implant(s) and/or special equipment prior to beginning this procedure. The abdomen was prepped and draped in the usual sterile fashion.    Veress needle was placed at the Palmer's point and insufflation was started after confirming a positive saline drop test and no immediate increase in abdominal pressure.  After reaching 15 mm, the Veress needle was removed and a 8 mm port was placed via optiview technique under umbilicus measured 289FYfrom gallbladder.  The abdomen was inspected and no abnormalities or injuries were found.  Under direct vision, ports were placed in the following locations: One 12 mm patient left of the umbilicus, 8cm from the optiviewed port, one 8 mm port placed to the patient right of the umbilical port 8 cm apart.  1 additional 8 mm port placed lateral to the 165mport.  Once ports were placed, The table was placed in the reverse Trendelenburg position with the right side up. The Xi platform was brought into the operative field and docked to the ports successfully.  An endoscope was placed through the umbilical port, fenestrated grasper through the adjacent patient right port, prograsp to the far patient  left port, and then a hook cautery in the left port.  Thickened omentum gently pulled off gallbladder.  Gallbladder decompressed with needle to facilitate grasping. The dome of the gallbladder was grasped with prograsp, passed and retracted over the dome of the liver. Dense, inflammatory adhesions between the gallbladder with gangernous mucosa underneath the thick rind and omentum, duodenum and transverse colon were lysed via hook cautery. The infundibulum was eventually grasped with the fenestrated grasper and retracted toward the right lower quadrant. This maneuver exposed Calot's triangle. The peritoneum overlying the gallbladder infundibulum was then dissected  and the cystic duct and cystic artery identified. Critical view of safety with the liver bed clearly visible behind the duct and artery with no additional structures noted.  The cystic duct and cystic artery clipped and divided close to the gallbladder.     The gallbladder was then dissected from its peritoneal and liver bed attachments by electrocautery. Portion of posterior wall separated during this portion, but able to be removed in piecemeal fashion. Hemostasis was checked prior to removing the hook cautery and the Endo Catch bag was then placed through the 12 mm port and the gallbladder was removed.  The gallbladder was passed off the table as a specimen. 15Fr round drain placed through RLQ port and secured to skin using 2-0 nylon. There was no evidence of bleeding from the gallbladder fossa or cystic artery or leakage of the bile from the cystic duct stump. Area extensively irrigated.The 12 mm port site closed with PMI using 0 vicryl under direct vision.  Abdomen desufflated and secondary trocars were removed under direct vision. No bleeding was noted. All skin incisions then closed with subcuticular sutures of 4-0  monocryl and dressed with topical skin adhesive. The orogastric tube was removed and patient extubated.  The patient tolerated  the procedure well and was taken to the postanesthesia care unit in stable condition.  All sponge and instrument count correct at end of procedure.

## 2021-10-24 NOTE — Anesthesia Preprocedure Evaluation (Addendum)
Anesthesia Evaluation  Patient identified by MRN, date of birth, ID band Patient awake    Reviewed: Allergy & Precautions, NPO status , Patient's Chart, lab work & pertinent test results  History of Anesthesia Complications Negative for: history of anesthetic complications  Airway Mallampati: III  TM Distance: <3 FB Neck ROM: full    Dental  (+) Chipped   Pulmonary neg shortness of breath, former smoker,    Pulmonary exam normal        Cardiovascular Exercise Tolerance: Good hypertension, Pt. on medications (-) angina(-) Past MI and (-) DOE Normal cardiovascular exam     Neuro/Psych negative neurological ROS  negative psych ROS   GI/Hepatic Neg liver ROS, neg GERD  ,  Endo/Other  diabetes, Type 2  Renal/GU negative Renal ROS  negative genitourinary   Musculoskeletal   Abdominal   Peds  Hematology negative hematology ROS (+)   Anesthesia Other Findings Acute Cholecysitis  Past Medical History: 1982: Allergy     Comment:  hay fever, dust, pets and feathers 2014: Diabetes mellitus without complication ([place])     Comment:  Controlled with diet No date: Hyperlipidemia 1995: Hypertension     Comment:  controlled with meds  Past Surgical History: No date: ANAL FISSURECTOMY No date: BACK SURGERY No date: HEMORRHOID SURGERY No date: SPINE SURGERY No date: VASECTOMY  BMI    Body Mass Index: 30.75 kg/m      Reproductive/Obstetrics negative OB ROS                             Anesthesia Physical  Anesthesia Plan  ASA: 3  Anesthesia Plan: General   Post-op Pain Management:    Induction: Intravenous  PONV Risk Score and Plan: 2 and Midazolam, Dexamethasone and Ondansetron  Airway Management Planned: Oral ETT and Video Laryngoscope Planned  Additional Equipment:   Intra-op Plan:   Post-operative Plan: Extubation in OR  Informed Consent: I have reviewed the patients  History and Physical, chart, labs and discussed the procedure including the risks, benefits and alternatives for the proposed anesthesia with the patient or authorized representative who has indicated his/her understanding and acceptance.     Dental Advisory Given  Plan Discussed with: Anesthesiologist, CRNA and Surgeon  Anesthesia Plan Comments: (Patient consented for risks of anesthesia including but not limited to:  - adverse reactions to medications - risk of airway placement if required - damage to eyes, teeth, lips or other oral mucosa - nerve damage due to positioning  - sore throat or hoarseness - Damage to heart, brain, nerves, lungs, other parts of body or loss of life  Patient voiced understanding.)       Anesthesia Quick Evaluation

## 2021-10-24 NOTE — Interval H&P Note (Signed)
No change. OK to proceed.

## 2021-10-24 NOTE — Anesthesia Postprocedure Evaluation (Signed)
Anesthesia Post Note  Patient: Steven Ramirez  Procedure(s) Performed: XI ROBOTIC ASSISTED LAPAROSCOPIC CHOLECYSTECTOMY INDOCYANINE GREEN FLUORESCENCE IMAGING (ICG)  Patient location during evaluation: PACU Anesthesia Type: General Level of consciousness: awake and alert Pain management: pain level controlled Vital Signs Assessment: post-procedure vital signs reviewed and stable Respiratory status: spontaneous breathing, nonlabored ventilation, respiratory function stable and patient connected to nasal cannula oxygen Cardiovascular status: blood pressure returned to baseline and stable Postop Assessment: no apparent nausea or vomiting Anesthetic complications: no   No notable events documented.   Last Vitals:  Vitals:   10/24/21 1915 10/24/21 1930  BP: 104/66 99/67  Pulse: 68 65  Resp: 14 16  Temp:  36.9 C  SpO2: 97% 95%    Last Pain:  Vitals:   10/24/21 1930  TempSrc: Oral  PainSc:                  Precious Haws Yedidya Duddy

## 2021-10-24 NOTE — Anesthesia Procedure Notes (Signed)
Procedure Name: Intubation Date/Time: 10/24/2021 4:17 PM  Performed by: Cammie Sickle, CRNAPre-anesthesia Checklist: Patient identified, Patient being monitored, Timeout performed, Emergency Drugs available and Suction available Patient Re-evaluated:Patient Re-evaluated prior to induction Oxygen Delivery Method: Circle system utilized Preoxygenation: Pre-oxygenation with 100% oxygen Induction Type: IV induction Ventilation: Mask ventilation without difficulty Laryngoscope Size: 3 and McGraph Grade View: Grade I Tube type: Oral Tube size: 7.0 mm Number of attempts: 1 Airway Equipment and Method: Stylet Placement Confirmation: ETT inserted through vocal cords under direct vision, positive ETCO2 and breath sounds checked- equal and bilateral Secured at: 22 cm Tube secured with: Tape Dental Injury: Teeth and Oropharynx as per pre-operative assessment

## 2021-10-25 LAB — CBC
HCT: 37.7 % — ABNORMAL LOW (ref 39.0–52.0)
Hemoglobin: 12.8 g/dL — ABNORMAL LOW (ref 13.0–17.0)
MCH: 32.2 pg (ref 26.0–34.0)
MCHC: 34 g/dL (ref 30.0–36.0)
MCV: 94.7 fL (ref 80.0–100.0)
Platelets: 197 10*3/uL (ref 150–400)
RBC: 3.98 MIL/uL — ABNORMAL LOW (ref 4.22–5.81)
RDW: 11.5 % (ref 11.5–15.5)
WBC: 8.9 10*3/uL (ref 4.0–10.5)
nRBC: 0 % (ref 0.0–0.2)

## 2021-10-25 NOTE — Progress Notes (Signed)
Mobility Specialist - Progress Note    10/25/21 1600  Mobility  Activity Ambulated independently in hallway;Stood at bedside;Dangled on edge of bed  Level of Assistance Independent  Assistive Device None  Distance Ambulated (ft) 300 ft  Activity Response Tolerated well  $Mobility charge 1 Mobility    Pt supine upon arrival using RA. Completes all activities indep, voicing no complaints and denying pain. Pt returns to bed with needs in reach and family at bedside.  [name] Mobility Specialist 10/25/21, 4:33 PM

## 2021-10-25 NOTE — TOC Initial Note (Signed)
Transition of Care [facility]) - Initial/Assessment Note    Patient Details  Name: Steven Ramirez MRN: [id number] Date of Birth: 30-Dec-1960  Transition of Care [facility]) CM/SW Contact:    Conception Oms, RN Phone Number: 10/25/2021, 10:58 AM  Clinical Narrative:                  Transition of Care [facility]) Screening Note   Patient Details  Name: Steven Ramirez Date of Birth: 07-22-1960   Transition of Care [facility]) CM/SW Contact:    Conception Oms, RN Phone Number: 10/25/2021, 10:58 AM  PCP, Charmian Muff, [facility] [employer], lives with spouse  Transition of Care Department [facility]) has reviewed patient and no TOC needs have been identified at this time. We will continue to monitor patient advancement through interdisciplinary progression rounds. If new patient transition needs arise, please place a TOC consult.    Expected Discharge Plan: Home/Self Care Barriers to Discharge: Continued Medical Work up   Patient Goals and CMS Choice        Expected Discharge Plan and Services Expected Discharge Plan: Home/Self Care       Living arrangements for the past 2 months: Single Family Home                 DME Arranged: N/A DME Agency: NA       HH Arranged: NA [place] Agency: NA        Prior Living Arrangements/Services Living arrangements for the past 2 months: [place] Lives with:: Spouse   Do you feel safe going back to the place where you live?: Yes               Activities of Daily Living Home Assistive Devices/Equipment: None ADL Screening (condition at time of admission) Patient's cognitive ability adequate to safely complete daily activities?: Yes Is the patient deaf or have difficulty hearing?: No Does the patient have difficulty seeing, even when wearing glasses/contacts?: Yes Does the patient have difficulty concentrating, remembering, or making decisions?: No Patient able to express need for assistance with ADLs?: Yes Does the patient have difficulty  dressing or bathing?: No Independently performs ADLs?: Yes (appropriate for developmental age) Does the patient have difficulty walking or climbing stairs?: No Weakness of Legs: None Weakness of Arms/Hands: None  Permission Sought/Granted                  Emotional Assessment              Admission diagnosis:  Acute cholecystitis [K81.0] Cholecystitis [K81.9] Patient Active Problem List   Diagnosis Date Noted   Acute cholecystitis 10/23/2021   Other male erectile dysfunction 10/05/2021   History of diabetes mellitus 08/14/2021   Essential hypertension 08/14/2021   Class 1 obesity due to excess calories with serious comorbidity and body mass index (BMI) of 30.0 to 30.9 in adult 08/14/2021   Hyperlipidemia 08/14/2021   PCP:  Michela Pitcher, NP Pharmacy:   [facility] [place] [place] [facility] [address] [place] [address] Phone: [phone] Fax: [phone]  [id number] [place]: [phone]: [phone]    Social Determinants of Health (SDOH) Interventions    Readmission Risk Interventions     No data to display

## 2021-10-25 NOTE — Progress Notes (Signed)
Subjective:  CC: Steven Ramirez is a 61 y.o. male  Hospital stay day 2, 1 Day Post-Op robo lap chole for gangernous cholecystitis  HPI: No issues overnight.  ROS:  General: Denies weight loss, weight gain, fatigue, fevers, chills, and night sweats. Heart: Denies chest pain, palpitations, racing heart, irregular heartbeat, leg pain or swelling, and decreased activity tolerance. Respiratory: Denies breathing difficulty, shortness of breath, wheezing, cough, and sputum. GI: Denies change in appetite, heartburn, nausea, vomiting, constipation, diarrhea, and blood in stool. GU: Denies difficulty urinating, pain with urinating, urgency, frequency, blood in urine.   Objective:   Temp:  [97.8 F (36.6 C)-99.5 F (37.5 C)] 99.5 F (37.5 C) (06/14 1921) Pulse Rate:  [57-86] 73 (06/14 1921) Resp:  [16-20] 20 (06/14 1921) BP: (111-132)/(66-84) 131/81 (06/14 1921) SpO2:  [94 %-98 %] 94 % (06/14 1921)     Height: '5\' 11"'$  (180.3 cm) Weight: 99.8 kg BMI (Calculated): 30.7   Intake/Output this shift:   Intake/Output Summary (Last 24 hours) at 10/25/2021 1934 Last data filed at 10/25/2021 1852 Gross per 24 hour  Intake 720 ml  Output 30 ml  Net 690 ml    Constitutional :  alert, cooperative, appears stated age, and no distress  Respiratory:  clear to auscultation bilaterally  Cardiovascular:  regular rate and rhythm  Gastrointestinal: Soft, no guarding, TTP around incisions . JP with old serosanguinous fluid  Skin: Cool and moist. Incision C/D/I  Psychiatric: Normal affect, non-agitated, not confused       LABS:     Latest Ref Rng & Units 10/24/2021    3:44 AM 10/23/2021   11:40 AM 08/14/2021   12:29 PM  CMP  Glucose 70 - 99 mg/dL 119  125  117   BUN 8 - 23 mg/dL '15  16  21   '$ Creatinine 0.61 - 1.24 mg/dL 1.05  0.94  0.91   Sodium 135 - 145 mmol/L 136  137  138   Potassium 3.5 - 5.1 mmol/L 3.8  3.5  4.3   Chloride 98 - 111 mmol/L 99  101  101   CO2 22 - 32 mmol/L 30  28  32   Calcium  8.9 - 10.3 mg/dL 9.0  9.4  10.3   Total Protein 6.5 - 8.1 g/dL 8.0  8.8    8.4  7.5   Total Bilirubin 0.3 - 1.2 mg/dL 1.3  1.3    1.4  0.7   Alkaline Phos 38 - 126 U/L 51  49    47  37   AST 15 - 41 U/L '18  17    20  27   '$ ALT 0 - 44 U/L '25  24    25  '$ 48       Latest Ref Rng & Units 10/25/2021    3:44 AM 10/24/2021    3:44 AM 10/23/2021   11:40 AM  CBC  WBC 4.0 - 10.5 K/uL 8.9  10.6  9.6   Hemoglobin 13.0 - 17.0 g/dL 12.8  13.9  15.0   Hematocrit 39.0 - 52.0 % 37.7  40.7  43.8   Platelets 150 - 400 K/uL 197  219  210     RADS: N/a Assessment:   S/p robo lap chole for gangernous cholecystitis.  Advance to regular, one more day of IV abx for gangernous nature, also to make sure no issues with bile leak due to extensive inflammation.    labs/images/medications/previous chart entries reviewed personally and relevant changes/updates noted above.

## 2021-10-25 NOTE — Plan of Care (Signed)

## 2021-10-25 NOTE — Plan of Care (Signed)
  Problem: Education: Goal: Knowledge of General Education information will improve Description: Including pain rating scale, medication(s)/side effects and non-pharmacologic comfort measures 10/25/2021 0228 by Parke Poisson, RN Outcome: Progressing 10/25/2021 0227 by Parke Poisson, RN Outcome: Progressing   Problem: Health Behavior/Discharge Planning: Goal: Ability to manage health-related needs will improve 10/25/2021 0228 by Parke Poisson, RN Outcome: Progressing 10/25/2021 0227 by Parke Poisson, RN Outcome: Progressing   Problem: Clinical Measurements: Goal: Ability to maintain clinical measurements within normal limits will improve 10/25/2021 0228 by Parke Poisson, RN Outcome: Progressing 10/25/2021 0227 by Parke Poisson, RN Outcome: Progressing Goal: Will remain free from infection 10/25/2021 0228 by Parke Poisson, RN Outcome: Progressing 10/25/2021 0227 by Parke Poisson, RN Outcome: Progressing Goal: Diagnostic test results will improve 10/25/2021 0228 by Parke Poisson, RN Outcome: Progressing 10/25/2021 0227 by Parke Poisson, RN Outcome: Progressing Goal: Respiratory complications will improve 10/25/2021 0228 by Parke Poisson, RN Outcome: Progressing 10/25/2021 0227 by Parke Poisson, RN Outcome: Progressing Goal: Cardiovascular complication will be avoided 10/25/2021 0228 by Parke Poisson, RN Outcome: Progressing 10/25/2021 0227 by Parke Poisson, RN Outcome: Progressing   Problem: Activity: Goal: Risk for activity intolerance will decrease 10/25/2021 0228 by Parke Poisson, RN Outcome: Progressing 10/25/2021 0227 by Parke Poisson, RN Outcome: Progressing   Problem: Nutrition: Goal: Adequate nutrition will be maintained 10/25/2021 0228 by Parke Poisson, RN Outcome: Progressing 10/25/2021 0227 by Parke Poisson, RN Outcome: Progressing   Problem: Coping: Goal: Level of anxiety will decrease 10/25/2021 0228 by Parke Poisson, RN Outcome:  Progressing 10/25/2021 0227 by Parke Poisson, RN Outcome: Progressing   Problem: Elimination: Goal: Will not experience complications related to bowel motility 10/25/2021 0228 by Parke Poisson, RN Outcome: Progressing 10/25/2021 0227 by Parke Poisson, RN Outcome: Progressing Goal: Will not experience complications related to urinary retention 10/25/2021 0228 by Parke Poisson, RN Outcome: Progressing 10/25/2021 0227 by Parke Poisson, RN Outcome: Progressing   Problem: Pain Managment: Goal: General experience of comfort will improve 10/25/2021 0228 by Parke Poisson, RN Outcome: Progressing 10/25/2021 0227 by Parke Poisson, RN Outcome: Progressing   Problem: Safety: Goal: Ability to remain free from injury will improve 10/25/2021 0228 by Parke Poisson, RN Outcome: Progressing 10/25/2021 0227 by Parke Poisson, RN Outcome: Progressing   Problem: Skin Integrity: Goal: Risk for impaired skin integrity will decrease 10/25/2021 0228 by Parke Poisson, RN Outcome: Progressing 10/25/2021 0227 by Parke Poisson, RN Outcome: Progressing

## 2021-10-26 LAB — BASIC METABOLIC PANEL
Anion gap: 8 (ref 5–15)
BUN: 17 mg/dL (ref 8–23)
CO2: 27 mmol/L (ref 22–32)
Calcium: 8.7 mg/dL — ABNORMAL LOW (ref 8.9–10.3)
Chloride: 102 mmol/L (ref 98–111)
Creatinine, Ser: 0.9 mg/dL (ref 0.61–1.24)
GFR, Estimated: >60 mL/min (ref >60)
Glucose, Bld: 122 mg/dL — ABNORMAL HIGH (ref 70–99)
Potassium: 3.3 mmol/L — ABNORMAL LOW (ref 3.5–5.1)
Sodium: 137 mmol/L (ref 135–145)

## 2021-10-26 LAB — HEPATIC FUNCTION PANEL
ALT: 30 U/L (ref 0–44)
AST: 26 U/L (ref 15–41)
Albumin: 3.4 g/dL — ABNORMAL LOW (ref 3.5–5.0)
Alkaline Phosphatase: 43 U/L (ref 38–126)
Bilirubin, Direct: 0.2 mg/dL (ref 0.0–0.2)
Indirect Bilirubin: 0.9 mg/dL (ref 0.3–0.9)
Total Bilirubin: 1.1 mg/dL (ref 0.3–1.2)
Total Protein: 7.2 g/dL (ref 6.5–8.1)

## 2021-10-26 LAB — CBC
HCT: 39.1 % (ref 39.0–52.0)
Hemoglobin: 13.3 g/dL (ref 13.0–17.0)
MCH: 31.9 pg (ref 26.0–34.0)
MCHC: 34 g/dL (ref 30.0–36.0)
MCV: 93.8 fL (ref 80.0–100.0)
Platelets: 218 10*3/uL (ref 150–400)
RBC: 4.17 MIL/uL — ABNORMAL LOW (ref 4.22–5.81)
RDW: 11.6 % (ref 11.5–15.5)
WBC: 7.5 10*3/uL (ref 4.0–10.5)
nRBC: 0 % (ref 0.0–0.2)

## 2021-10-26 LAB — SURGICAL PATHOLOGY

## 2021-10-26 MED ORDER — IBUPROFEN 800 MG PO TABS: 800.0000 mg | ORAL_TABLET | Freq: Three times a day (TID) | ORAL | 0 refills | Status: DC | PRN

## 2021-10-26 MED ORDER — HYDROCODONE-ACETAMINOPHEN 5-325 MG PO TABS: 1.0000 | ORAL_TABLET | Freq: Four times a day (QID) | ORAL | 0 refills | Status: DC | PRN

## 2021-10-26 MED ORDER — AMOXICILLIN-POT CLAVULANATE 875-125 MG PO TABS: 1.0000 | ORAL_TABLET | Freq: Two times a day (BID) | ORAL | 0 refills | Status: AC

## 2021-10-26 MED ORDER — DOCUSATE SODIUM 100 MG PO CAPS: 100.0000 mg | ORAL_CAPSULE | Freq: Two times a day (BID) | ORAL | 0 refills | Status: AC | PRN

## 2021-10-26 MED ORDER — ACETAMINOPHEN 325 MG PO TABS: 650.0000 mg | ORAL_TABLET | Freq: Three times a day (TID) | ORAL | 0 refills | Status: AC | PRN

## 2021-10-26 NOTE — Progress Notes (Signed)
Mobility Specialist - Progress Note   10/26/21 0800  Mobility  Activity Ambulated independently in hallway  Level of [place] None  Distance Ambulated (ft) 300 ft  Activity Response Tolerated well  $Mobility charge 1 Mobility    [name] Mobility Specialist 10/26/21, 8:42 AM

## 2021-10-26 NOTE — Discharge Summary (Signed)
Physician Discharge Summary  Patient ID: Steven Ramirez MRN: [id number] DOB/AGE: November 28, 1960 61 y.o.  Admit date: 10/23/2021 Discharge date: 10/26/21  Admission Diagnoses: acute cholecystitis  Discharge Diagnoses:  Acute gangernous cholecystitis  Discharged Condition: good  Hospital Course: admitted for above. Underwent surgery.  Please see op note for details.  Post op, recovered as expected.  At time of d/c, tolerating diet and pain controlled.  Home with abx and drain in place due to gangernous nature.  Consults: None  Discharge Exam: Blood pressure 127/80, pulse 63, temperature 98.3 F (36.8 C), resp. rate 18, height '5\' 11"'$  (1.803 m), weight 99.8 kg, SpO2 97 %. General appearance: alert, cooperative, and no distress GI: soft, non-tender; bowel sounds normal; no masses,  no organomegaly and drain with serous drainage  Disposition:  Discharge disposition: 01-Home or Self Care        Allergies as of 10/26/2021       Reactions   Peach [prunus Persica]         Medication List     TAKE these medications    acetaminophen 325 MG tablet Commonly known as: Tylenol Take 2 tablets (650 mg total) by mouth every 8 (eight) hours as needed for mild pain.   amoxicillin-clavulanate 875-125 MG tablet Commonly known as: AUGMENTIN Take 1 tablet by mouth 2 (two) times daily for 5 days.   atorvastatin 20 MG tablet Commonly known as: LIPITOR Take 1 tablet (20 mg total) by mouth daily.   cetirizine 10 MG tablet Commonly known as: ZYRTEC   docusate sodium 100 MG capsule Commonly known as: Colace Take 1 capsule (100 mg total) by mouth 2 (two) times daily as needed for up to 10 days for mild constipation.   HYDROcodone-acetaminophen 5-325 MG tablet Commonly known as: Norco Take 1 tablet by mouth every 6 (six) hours as needed for up to 6 doses for moderate pain.   ibuprofen 800 MG tablet Commonly known as: ADVIL Take 1 tablet (800 mg total) by mouth every 8 (eight) hours as  needed for mild pain or moderate pain.   tadalafil 10 MG tablet Commonly known as: CIALIS Take 1 tablet by mouth 30 mins prior to sexual intercourse as needed   valsartan-hydrochlorothiazide 320-12.5 MG tablet Commonly known as: Diovan HCT Take 1 tablet by mouth daily.        Follow-up Information     Benjamine Sprague, DO Follow up on 10/31/2021.   Specialty: Surgery Why: post op lap chole, possible drain removal appointment @ 9:45 a.m. Contact information: [address] [place] [address] [phone]                  Total time spent arranging discharge was >17mn. Signed: IBenjamine Sprague6/15/2023, 11:24 AM

## 2021-10-26 NOTE — Discharge Instructions (Signed)
Laparoscopic Cholecystectomy, Care After This sheet gives you information about how to care for yourself after your procedure. Your doctor may also give you more specific instructions. If you have problems or questions, contact your doctor. Follow these instructions at home: Care for cuts from surgery (incisions)  Follow instructions from your doctor about how to take care of your cuts from surgery. Make sure you: Wash your hands with soap and water before you change your bandage (dressing). If you cannot use soap and water, use hand sanitizer. Change your bandage as told by your doctor. Leave stitches (sutures), skin glue, or skin tape (adhesive) strips in place. They may need to stay in place for 2 weeks or longer. If tape strips get loose and curl up, you may trim the loose edges. Do not remove tape strips completely unless your doctor says it is okay. Do not take baths, swim, or use a hot tub until your doctor says it is okay. OK TO SHOWER 24HRS AFTER YOUR SURGERY.  Check your surgical cut area every day for signs of infection. Check for: More redness, swelling, or pain. More fluid or blood. Warmth. Pus or a bad smell. Activity Do not drive or use heavy machinery while taking prescription pain medicine. Do not play contact sports until your doctor says it is okay. Do not drive for 24 hours if you were given a medicine to help you relax (sedative). Rest as needed. Do not return to work or school until your doctor says it is okay. General instructions  tylenol and advil as needed for discomfort.  Please alternate between the two every four hours as needed for pain.    Use narcotics, if prescribed, only when tylenol and motrin is not enough to control pain.  325-650mg every 8hrs to max of 3000mg/24hrs (including the 325mg in every norco dose) for the tylenol.    Advil up to 800mg per dose every 8hrs as needed for pain.   To prevent or treat constipation while you are taking prescription  pain medicine, your doctor may recommend that you: Drink enough fluid to keep your pee (urine) clear or pale yellow. Take over-the-counter or prescription medicines. Eat foods that are high in fiber, such as fresh fruits and vegetables, whole grains, and beans. Limit foods that are high in fat and processed sugars, such as fried and sweet foods. Contact a doctor if: You develop a rash. You have more redness, swelling, or pain around your surgical cuts. You have more fluid or blood coming from your surgical cuts. Your surgical cuts feel warm to the touch. You have pus or a bad smell coming from your surgical cuts. You have a fever. One or more of your surgical cuts breaks open. You have trouble breathing. You have chest pain. You have pain that is getting worse in your shoulders. You faint or feel dizzy when you stand. You have very bad pain in your belly (abdomen). You are sick to your stomach (nauseous) for more than one day. You have throwing up (vomiting) that lasts for more than one day. You have leg pain. This information is not intended to replace advice given to you by your health care provider. Make sure you discuss any questions you have with your health care provider. Document Released: 02/07/2008 Document Revised: 11/19/2015 Document Reviewed: 10/17/2015 Elsevier Interactive Patient Education  2019 [employer]  

## 2021-10-26 NOTE — Plan of Care (Incomplete)

## 2021-11-06 ENCOUNTER — Encounter: Payer: Self-pay | Admitting: Nurse Practitioner

## 2021-11-06 DIAGNOSIS — N528 Other male erectile dysfunction: Secondary | ICD-10-CM

## 2021-11-08 ENCOUNTER — Other Ambulatory Visit: Payer: Self-pay | Admitting: Nurse Practitioner

## 2021-11-08 DIAGNOSIS — N528 Other male erectile dysfunction: Secondary | ICD-10-CM

## 2021-11-08 MED ORDER — TADALAFIL 10 MG PO TABS: ORAL_TABLET | ORAL | 5 refills | Status: DC

## 2021-11-22 MED ORDER — TADALAFIL 10 MG PO TABS: 10.0000 mg | ORAL_TABLET | Freq: Every day | ORAL | 1 refills | Status: DC | PRN

## 2021-11-24 MED ORDER — TADALAFIL 10 MG PO TABS: 10.0000 mg | ORAL_TABLET | Freq: Every day | ORAL | 1 refills | Status: DC | PRN

## 2021-11-24 NOTE — Addendum Note (Signed)
Addended by: Michela Pitcher on: 11/24/2021 01:40 PM   Modules accepted: Orders

## 2022-02-22 ENCOUNTER — Ambulatory Visit (INDEPENDENT_AMBULATORY_CARE_PROVIDER_SITE_OTHER): Payer: 59 | Admitting: Dermatology

## 2022-02-22 DIAGNOSIS — L570 Actinic keratosis: Secondary | ICD-10-CM

## 2022-02-22 DIAGNOSIS — L280 Lichen simplex chronicus: Secondary | ICD-10-CM

## 2022-02-22 DIAGNOSIS — T63301S Toxic effect of unspecified spider venom, accidental (unintentional), sequela: Secondary | ICD-10-CM | POA: Diagnosis not present

## 2022-02-22 DIAGNOSIS — L578 Other skin changes due to chronic exposure to nonionizing radiation: Secondary | ICD-10-CM

## 2022-02-22 MED ORDER — EUCRISA 2 % EX OINT: TOPICAL_OINTMENT | CUTANEOUS | 2 refills | Status: DC

## 2022-02-22 MED ORDER — CLOBETASOL PROPIONATE 0.05 % EX CREA: TOPICAL_CREAM | CUTANEOUS | 0 refills | Status: DC

## 2022-02-22 NOTE — Progress Notes (Signed)
Follow-Up Visit   Subjective  Steven Ramirez is a 61 y.o. male who presents for the following: Rash (6 months f/u on the arms and neck LSC ) and Actinic Keratosis (Recheck his scalp hx of precancers). The patient has spots, moles and lesions to be evaluated, some may be new or changing and the patient has concerns that these could be cancer.  The following portions of the chart were reviewed this encounter and updated as appropriate:   Tobacco  Allergies  Meds  Problems  Med Hx  Surg Hx  Fam Hx     Review of Systems:  No other skin or systemic complaints except as noted in HPI or Assessment and Plan.  Objective  Well appearing patient in no apparent distress; mood and affect are within normal limits.  A focused examination was performed including face,scalp,arms. Relevant physical exam findings are noted in the Assessment and Plan.  Right Forearm - Anterior 1.5 x 1.0 cm nodule  at right post neck 3.0 x 1.5 cm nodule at Right prox forearm  face x 1 Erythematous thin papules/macules with gritty scale.    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 Use as directed. Long-term use can cause thinning of the skin.  Start Eucrisa ointment apply to affected skin once a day   May consider ILK injections at the next visit if not better   Topical steroids (such as triamcinolone, fluocinolone, fluocinonide, mometasone, clobetasol, halobetasol, betamethasone, hydrocortisone) can cause thinning and lightening of the skin if they are used for too long in the same area. Your physician has selected the right strength medicine for your problem and area affected on the body. Please use your medication only as directed by your physician to prevent side  effects.    Related Medications clobetasol cream (TEMOVATE) 0.05 % Apply to affected skin qd 5 days a week, Avoid applying to face, groin, and axilla. Use as directed. Long-term use can cause thinning of the skin. Crisaborole (EUCRISA) 2 % OINT Apply to affected skin once a day  AK (actinic keratosis) face x 1 Actinic keratoses are precancerous spots that appear secondary to cumulative UV radiation exposure/sun exposure over time. They are chronic with expected duration over 1 year. A portion of actinic keratoses will progress to squamous cell carcinoma of the skin. It is not possible to reliably predict which spots will progress to skin cancer and so treatment is recommended to prevent development of skin cancer.  Recommend daily broad spectrum sunscreen SPF 30+ to sun-exposed areas, reapply every 2 hours as needed.  Recommend staying in the shade or wearing long sleeves, sun glasses (UVA+UVB protection) and wide brim hats (4-inch brim around the entire circumference of the hat). Call for new or changing lesions.   Destruction of lesion - face x 1 Complexity: simple   Destruction method: cryotherapy   Informed consent: discussed and consent obtained   Timeout:  patient name, date of birth, surgical site, and procedure verified Lesion destroyed using liquid nitrogen: Yes   Region frozen until ice ball extended beyond lesion: Yes   Outcome: patient tolerated procedure well with no complications   Post-procedure details: wound care instructions given    Actinic Damage - chronic, secondary to cumulative UV radiation exposure/sun exposure over time - diffuse  scaly erythematous macules with underlying dyspigmentation - Recommend daily broad spectrum sunscreen SPF 30+ to sun-exposed areas, reapply every 2 hours as needed.  - Recommend staying in the shade or wearing long sleeves, sun glasses (UVA+UVB protection) and wide brim hats (4-inch brim around the entire circumference of the hat). -  Call for new or changing lesions.  Return in about 1 year (around 02/23/2023) for Aks .  IMarye Round, CMA, am acting as scribe for Sarina Ser, MD  Documentation: I have reviewed the above documentation for accuracy and completeness, and I agree with the above.  Sarina Ser, MD

## 2022-02-22 NOTE — Patient Instructions (Addendum)
Cryotherapy Aftercare  Wash gently with soap and water everyday.   Apply Vaseline and Band-Aid daily until healed.     Due to recent changes in healthcare laws, you may see results of your pathology and/or laboratory studies on MyChart before the doctors have had a chance to review them. We understand that in some cases there may be results that are confusing or concerning to you. Please understand that not all results are received at the same time and often the doctors may need to interpret multiple results in order to provide you with the best plan of care or course of treatment. Therefore, we ask that you please give us 2 business days to thoroughly review all your results before contacting the office for clarification. Should we see a critical lab result, you will be contacted sooner.   If You Need Anything After Your Visit  If you have any questions or concerns for your doctor, please call our main line at [phone] and press option 4 to reach your doctor's medical assistant. If no one answers, please leave a voicemail as directed and we will return your call as soon as possible. Messages left after 4 pm will be answered the following business day.   You may also send us a message via MyChart. We typically respond to MyChart messages within 1-2 business days.  For prescription refills, please ask your pharmacy to contact our office. Our fax number is [phone].  If you have an urgent issue when the clinic is closed that cannot wait until the next business day, you can page your doctor at the number below.    Please note that while we do our best to be available for urgent issues outside of office hours, we are not available 24/7.   If you have an urgent issue and are unable to reach us, you may choose to seek medical care at your doctor's office, retail clinic, urgent care center, or emergency room.  If you have a medical emergency, please immediately call 911 or go to the  emergency department.  Pager Numbers  - Dr. Kowalski: [phone]  - Dr. Moye: [phone]  - Dr. Stewart: [phone]  In the event of inclement weather, please call our main line at [phone] for an update on the status of any delays or closures.  Dermatology Medication Tips: Please keep the boxes that topical medications come in in order to help keep track of the instructions about where and how to use these. Pharmacies typically print the medication instructions only on the boxes and not directly on the medication tubes.   If your medication is too expensive, please contact our office at [phone] option 4 or send us a message through MyChart.   We are unable to tell what your co-pay for medications will be in advance as this is different depending on your insurance coverage. However, we may be able to find a substitute medication at lower cost or fill out paperwork to get insurance to cover a needed medication.   If a prior authorization is required to get your medication covered by your insurance company, please allow us 1-2 business days to complete this process.  Drug prices often vary depending on where the prescription is filled and some pharmacies may offer cheaper prices.  The website www.goodrx.com contains coupons for medications through different pharmacies. The prices here do not account for what the cost may be with help from insurance (it may be cheaper with your insurance), but the website can   give you the price if you did not use any insurance.  - You can print the associated coupon and take it with your prescription to the pharmacy.  - You may also stop by our office during regular business hours and pick up a GoodRx coupon card.  - If you need your prescription sent electronically to a different pharmacy, notify our office through [facility] MyChart or by phone at [phone] option 4.     Si Usted Necesita Algo Despus de Su Visita  Tambin puede  enviarnos un mensaje a travs de MyChart. Por lo general respondemos a los mensajes de MyChart en el transcurso de 1 a 2 das hbiles.  Para renovar recetas, por favor pida a su farmacia que se ponga en contacto con nuestra oficina. Nuestro nmero de fax es el [phone].  Si tiene un asunto urgente cuando la clnica est cerrada y que no puede esperar hasta el siguiente da hbil, puede llamar/localizar a su doctor(a) al nmero que aparece a continuacin.   Por favor, tenga en cuenta que aunque hacemos todo lo posible para estar disponibles para asuntos urgentes fuera del horario de oficina, no estamos disponibles las 24 horas del da, los 7 das de la semana.   Si tiene un problema urgente y no puede comunicarse con nosotros, puede optar por buscar atencin mdica  en el consultorio de su doctor(a), en una clnica privada, en un centro de atencin urgente o en una sala de emergencias.  Si tiene una emergencia mdica, por favor llame inmediatamente al 911 o vaya a la sala de emergencias.  Nmeros de bper  - Dr. Kowalski: [phone]  - Dra. Moye: [phone]  - Dra. Stewart: [phone]  En caso de inclemencias del tiempo, por favor llame a nuestra lnea principal al [phone] para una actualizacin sobre el estado de cualquier retraso o cierre.  Consejos para la medicacin en dermatologa: Por favor, guarde las cajas en las que vienen los medicamentos de uso tpico para ayudarle a seguir las instrucciones sobre dnde y cmo usarlos. Las farmacias generalmente imprimen las instrucciones del medicamento slo en las cajas y no directamente en los tubos del medicamento.   Si su medicamento es muy caro, por favor, pngase en contacto con nuestra oficina llamando al [phone] y presione la opcin 4 o envenos un mensaje a travs de MyChart.   No podemos decirle cul ser su copago por los medicamentos por adelantado ya que esto es diferente dependiendo de la cobertura de su seguro.  Sin embargo, es posible que podamos encontrar un medicamento sustituto a menor costo o llenar un formulario para que el seguro cubra el medicamento que se considera necesario.   Si se requiere una autorizacin previa para que su compaa de seguros cubra su medicamento, por favor permtanos de 1 a 2 das hbiles para completar este proceso.  Los precios de los medicamentos varan con frecuencia dependiendo del lugar de dnde se surte la receta y alguna farmacias pueden ofrecer precios ms baratos.  El sitio web www.goodrx.com tiene cupones para medicamentos de diferentes farmacias. Los precios aqu no tienen en cuenta lo que podra costar con la ayuda del seguro (puede ser ms barato con su seguro), pero el sitio web puede darle el precio si no utiliz ningn seguro.  - Puede imprimir el cupn correspondiente y llevarlo con su receta a la farmacia.  - Tambin puede pasar por nuestra oficina durante el horario de atencin regular y recoger una tarjeta de cupones de GoodRx.  -   Si necesita que su receta se enve electrnicamente a una farmacia diferente, informe a nuestra oficina a travs de MyChart de [place] o por telfono llamando al [phone] y presione la opcin 4.  

## 2022-03-06 ENCOUNTER — Encounter: Payer: Self-pay | Admitting: Dermatology

## 2022-07-26 ENCOUNTER — Encounter: Payer: Self-pay | Admitting: Nurse Practitioner

## 2022-07-26 ENCOUNTER — Ambulatory Visit: Payer: Managed Care, Other (non HMO) | Admitting: Nurse Practitioner

## 2022-07-26 VITALS — BP 130/84 | HR 72 | Temp 97.7°F | Resp 16 | Ht 71.0 in | Wt 224.1 lb

## 2022-07-26 DIAGNOSIS — N528 Other male erectile dysfunction: Secondary | ICD-10-CM

## 2022-07-26 DIAGNOSIS — E785 Hyperlipidemia, unspecified: Secondary | ICD-10-CM | POA: Diagnosis not present

## 2022-07-26 DIAGNOSIS — Z125 Encounter for screening for malignant neoplasm of prostate: Secondary | ICD-10-CM

## 2022-07-26 DIAGNOSIS — E6609 Other obesity due to excess calories: Secondary | ICD-10-CM | POA: Diagnosis not present

## 2022-07-26 DIAGNOSIS — Z6830 Body mass index (BMI) 30.0-30.9, adult: Secondary | ICD-10-CM

## 2022-07-26 DIAGNOSIS — I10 Essential (primary) hypertension: Secondary | ICD-10-CM

## 2022-07-26 DIAGNOSIS — R221 Localized swelling, mass and lump, neck: Secondary | ICD-10-CM

## 2022-07-26 DIAGNOSIS — R7303 Prediabetes: Secondary | ICD-10-CM | POA: Diagnosis not present

## 2022-07-26 LAB — CBC
HCT: 42.7 % (ref 39.0–52.0)
Hemoglobin: 15 g/dL (ref 13.0–17.0)
MCHC: 35.1 g/dL (ref 30.0–36.0)
MCV: 94.7 fl (ref 78.0–100.0)
Platelets: 179 10*3/uL (ref 150.0–400.0)
RBC: 4.51 Mil/uL (ref 4.22–5.81)
RDW: 12.8 % (ref 11.5–15.5)
WBC: 4.5 10*3/uL (ref 4.0–10.5)

## 2022-07-26 LAB — POCT GLYCOSYLATED HEMOGLOBIN (HGB A1C): Hemoglobin A1C: 6 % — AB (ref 4.0–5.6)

## 2022-07-26 LAB — COMPREHENSIVE METABOLIC PANEL
ALT: 46 U/L (ref 0–53)
AST: 38 U/L — ABNORMAL HIGH (ref 0–37)
Albumin: 4.4 g/dL (ref 3.5–5.2)
Alkaline Phosphatase: 50 U/L (ref 39–117)
BUN: 19 mg/dL (ref 6–23)
CO2: 29 mEq/L (ref 19–32)
Calcium: 9.5 mg/dL (ref 8.4–10.5)
Chloride: 102 mEq/L (ref 96–112)
Creatinine, Ser: 0.99 mg/dL (ref 0.40–1.50)
GFR: 81.91 mL/min (ref >60.00)
Glucose, Bld: 119 mg/dL — ABNORMAL HIGH (ref 70–99)
Potassium: 4 mEq/L (ref 3.5–5.1)
Sodium: 139 mEq/L (ref 135–145)
Total Bilirubin: 0.9 mg/dL (ref 0.2–1.2)
Total Protein: 7.4 g/dL (ref 6.0–8.3)

## 2022-07-26 LAB — PSA: PSA: 1.11 ng/mL (ref 0.10–4.00)

## 2022-07-26 LAB — LIPID PANEL
Cholesterol: 132 mg/dL (ref 0–200)
HDL: 34.7 mg/dL — ABNORMAL LOW (ref >39.00)
LDL Cholesterol: 57 mg/dL (ref 0–99)
NonHDL: 96.95
Total CHOL/HDL Ratio: 4
Triglycerides: 198 mg/dL — ABNORMAL HIGH (ref 0.0–149.0)
VLDL: 39.6 mg/dL (ref 0.0–40.0)

## 2022-07-26 LAB — TESTOSTERONE: Testosterone: 311.83 ng/dL (ref 300.00–890.00)

## 2022-07-26 NOTE — Assessment & Plan Note (Signed)
Patient's A1c 6.0 in office today.  This is a trend down from 6.1.  Continue work on lifestyle modifications

## 2022-07-26 NOTE — Assessment & Plan Note (Signed)
Likely an enlarged salivary gland.  Did offer to go an ultrasound patient he would like to think about it over the weekend.  Patient states it has been 2-week and is gotten some better.  He will reach out to me via MyChart if he decides to have an ultrasound performed location is discussed and given to him today in office

## 2022-07-26 NOTE — Assessment & Plan Note (Signed)
Pending lipid panel.  Patient currently on atorvastatin 20 mg.  He is having some muscle tightness and headaches and is curious if his statin medication.  He will take a 2-week holiday off the medication and reach out to me to let me know how his symptoms do if improved we will switch him from 2 different statin medication if not he will go back on atorvastatin 20 mg as prescribed

## 2022-07-26 NOTE — Progress Notes (Signed)
Established Patient Office Visit  Subjective   Patient ID: Steven Ramirez, male    DOB: Apr 14, 1961  Age: 62 y.o. MRN: [id number]  Chief Complaint  Patient presents with   Hypertension   Medication Refill   Facial Swelling    Right side by the ear X 2 weeks      HTN: patients is doing well on medication. States that he does not check his blood pressure frequently on account of his blood pressure meter has been missing out.  Facial swelling: states that it is right sided and thinks it is the silva gland. States that it was tender to the touch and has improved some. States that eating does not make a difference. States that he has not tired anything over the counter  HLD: he has a concern for the statin medication. States that over the past 8 months that he has noticed some mucles tightness and leg cramps. He is concerned that it is related to statin.  Testosterone: States that his dad was dx with low T when it became a thing. States that he does experience erectile dysfunction. 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  HENT:         Neck swelling  Respiratory:  Negative for shortness of breath.   Cardiovascular:  Negative for chest pain.  Gastrointestinal:  Negative for abdominal pain, nausea and vomiting.       BM daily   Genitourinary:  Negative for dysuria and hematuria.  Neurological:  Negative for headaches.      Objective:     BP 130/84   Pulse 72   Temp 97.7 F (36.5 C)   Resp 16   Ht '5\' 11"'$  (1.803 m)   Wt 224 lb 2 oz (101.7 kg)   SpO2 96%   BMI 31.26 kg/m  BP Readings from Last 3 Encounters:  07/26/22 130/84  10/26/21 127/80  10/05/21 128/88   Wt Readings from Last 3 Encounters:  07/26/22 224 lb 2 oz (101.7 kg)  10/24/21 220 lb 0.3 oz (99.8 kg)  10/05/21 223 lb 8 oz (101.4 kg)      Physical Exam Vitals and nursing note reviewed.  Constitutional:       Appearance: Normal appearance.  HENT:     Head:     Salivary Glands: Right salivary gland is diffusely enlarged. Right salivary gland is not tender.      Right Ear: Tympanic membrane, ear canal and external ear normal.     Left Ear: Tympanic membrane, ear canal and external ear normal.  Cardiovascular:     Rate and Rhythm: Normal rate and regular rhythm.     Heart sounds: Normal heart sounds.  Pulmonary:     Effort: Pulmonary effort is normal.     Breath sounds: Normal breath sounds.  Lymphadenopathy:     Cervical: No cervical adenopathy.  Neurological:     Mental Status: He is alert.      Results for orders placed or performed in visit on 07/26/22  POCT glycosylated hemoglobin (Hb A1C)  Result Value Ref Range   Hemoglobin A1C 6.0 (A) 4.0 - 5.6 %   HbA1c POC (<> result, manual entry)     HbA1c, POC (prediabetic range)     HbA1c, POC (controlled diabetic range)        The 10-year ASCVD risk score (Arnett DK, et  al., 2019) is: 12.2%    Assessment & Plan:   Problem List Items Addressed This Visit       Cardiovascular and Mediastinum   Essential hypertension    Patient is on valsartan-hydrochlorothiazide 320 mg - 12.5 mg.  Blood pressure within normal limits.  Continue medication as prescribed      Relevant Orders   CBC   Comprehensive metabolic panel     Other   Class 1 obesity due to excess calories with serious comorbidity and body mass index (BMI) of 30.0 to 30.9 in adult   Relevant Orders   Testosterone   Hyperlipidemia    Pending lipid panel.  Patient currently on atorvastatin 20 mg.  He is having some muscle tightness and headaches and is curious if his statin medication.  He will take a 2-week holiday off the medication and reach out to me to let me know how his symptoms do if improved we will switch him from 2 different statin medication if not he will go back on atorvastatin 20 mg as prescribed      Relevant Orders   Lipid panel   Other male  erectile dysfunction    Prescribed tadalafil in the past.  Pending testosterone      Relevant Orders   Testosterone   Prediabetes - Primary    Patient's A1c 6.0 in office today.  This is a trend down from 6.1.  Continue work on lifestyle modifications      Relevant Orders   POCT glycosylated hemoglobin (Hb A1C) (Completed)   Neck swelling    Likely an enlarged salivary gland.  Did offer to go an ultrasound patient he would like to think about it over the weekend.  Patient states it has been 2-week and is gotten some better.  He will reach out to me via MyChart if he decides to have an ultrasound performed location is discussed and given to him today in office      Other Visit Diagnoses     Screening for prostate cancer       Relevant Orders   PSA       Return in about 6 weeks (around 09/06/2022) for CPE .    Romilda Garret, NP

## 2022-07-26 NOTE — Patient Instructions (Signed)
Nice to see you today I will be in touch with the labs once I have them You can stop taking the atorvastatin for 2 weeks and see if the muscle symptoms stop Reach out to me on Monday and let me know if you want to do the imaging of the neck

## 2022-07-26 NOTE — Assessment & Plan Note (Signed)
Prescribed tadalafil in the past.  Pending testosterone

## 2022-07-26 NOTE — Assessment & Plan Note (Signed)
Patient is on valsartan-hydrochlorothiazide 320 mg - 12.5 mg.  Blood pressure within normal limits.  Continue medication as prescribed

## 2022-07-27 MED ORDER — TADALAFIL 10 MG PO TABS: 10.0000 mg | ORAL_TABLET | Freq: Every day | ORAL | 3 refills | Status: DC | PRN

## 2022-07-27 MED ORDER — VALSARTAN-HYDROCHLOROTHIAZIDE 320-12.5 MG PO TABS: 1.0000 | ORAL_TABLET | Freq: Every day | ORAL | 3 refills | Status: DC

## 2022-07-30 ENCOUNTER — Other Ambulatory Visit: Payer: Self-pay

## 2022-07-30 DIAGNOSIS — N528 Other male erectile dysfunction: Secondary | ICD-10-CM

## 2022-07-30 MED ORDER — TADALAFIL 10 MG PO TABS: 10.0000 mg | ORAL_TABLET | Freq: Every day | ORAL | 3 refills | Status: DC | PRN

## 2022-07-30 NOTE — Telephone Encounter (Signed)
Called and said that Tadalafil was not covered on insurance sending [facility]. Will [facility]

## 2022-08-02 NOTE — Addendum Note (Signed)
Addended by: Michela Pitcher on: 08/02/2022 01:32 PM   Modules accepted: Orders

## 2022-08-07 ENCOUNTER — Ambulatory Visit
Admission: RE | Admit: 2022-08-07 | Discharge: 2022-08-07 | Disposition: A | Discharge status: Home / Self Care | Payer: Managed Care, Other (non HMO) | Source: Ambulatory Visit | Attending: Nurse Practitioner | Admitting: Nurse Practitioner

## 2022-08-07 DIAGNOSIS — R221 Localized swelling, mass and lump, neck: Secondary | ICD-10-CM | POA: Diagnosis not present

## 2022-08-15 ENCOUNTER — Other Ambulatory Visit: Payer: Self-pay | Admitting: Nurse Practitioner

## 2022-08-15 DIAGNOSIS — R221 Localized swelling, mass and lump, neck: Secondary | ICD-10-CM

## 2022-09-10 IMAGING — CT CT ABD-PELV W/ CM
2 of 5 series · 15 of 46 positions shown, 17 images · IV contrast (APPLIED)
Comparison: None Available.

CLINICAL DATA: Abdominal pain.

EXAM:
CT ABDOMEN AND PELVIS WITH CONTRAST
TECHNIQUE: Multidetector CT imaging of the abdomen and pelvis was performed
using the standard protocol following bolus administration of
intravenous contrast.

[Series 2: abdomen 5.0 · axial · 0.84mm/px · z∈[-1037,-537]mm · 12 of 120 slices shown, 14 images]
[im 10/120  soft-tissue]
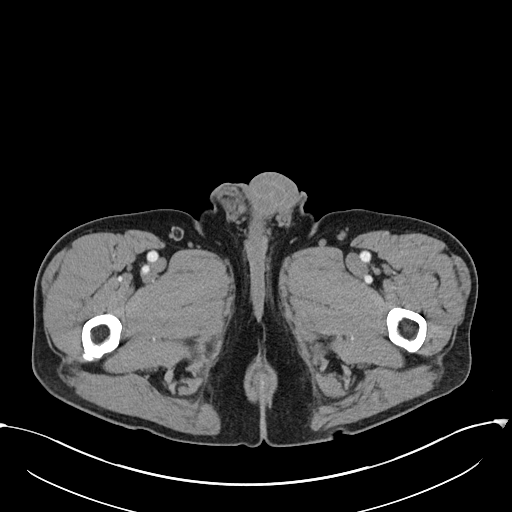
[im 10/120  bone]
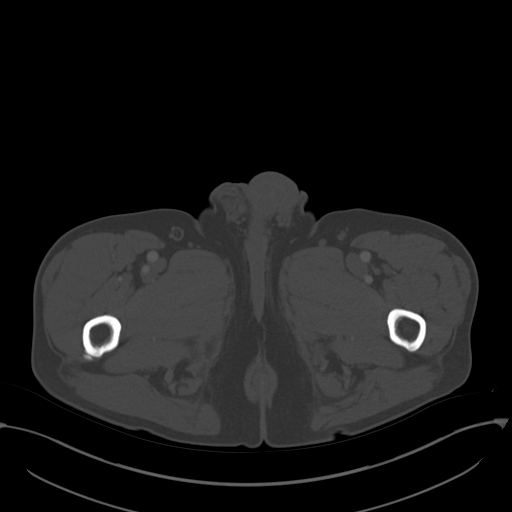
[im 19/120  soft-tissue]
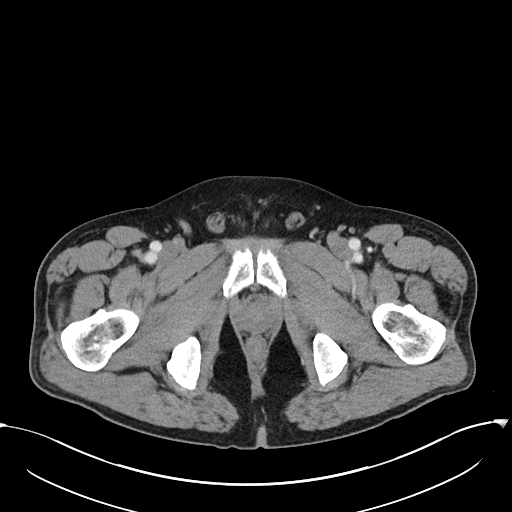
[im 28/120  soft-tissue]
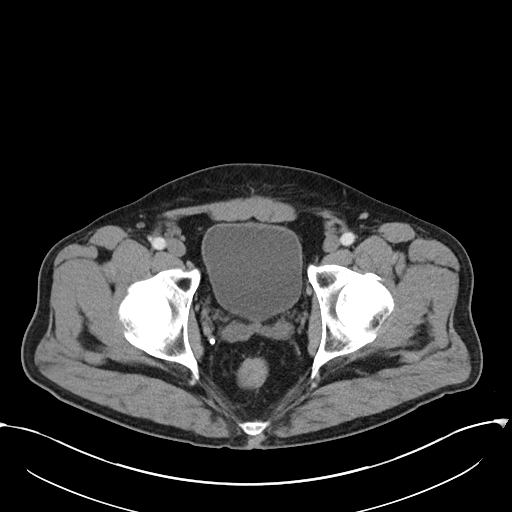
[im 37/120  soft-tissue]
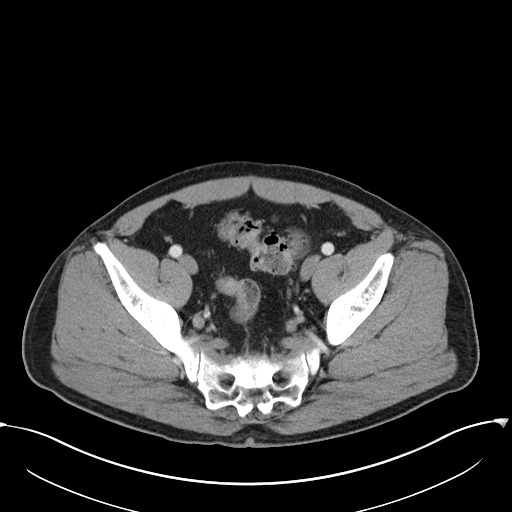
[im 46/120  soft-tissue]
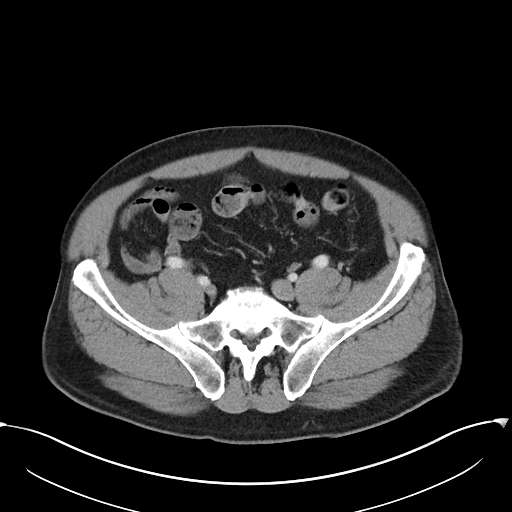
[im 55/120  soft-tissue]
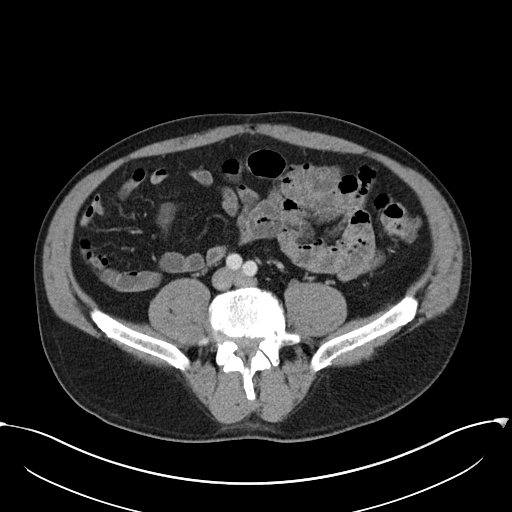
[im 65/120  soft-tissue]
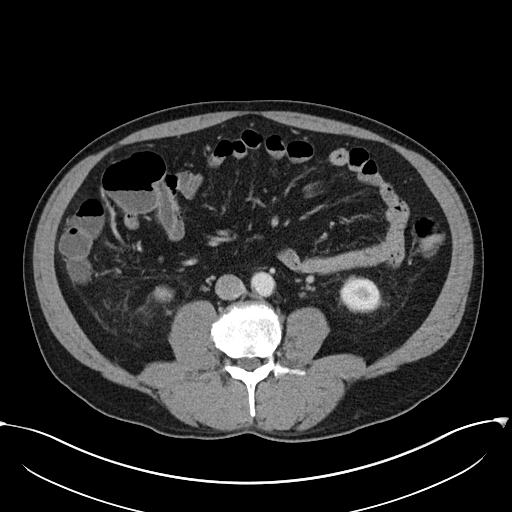
[im 74/120  soft-tissue]
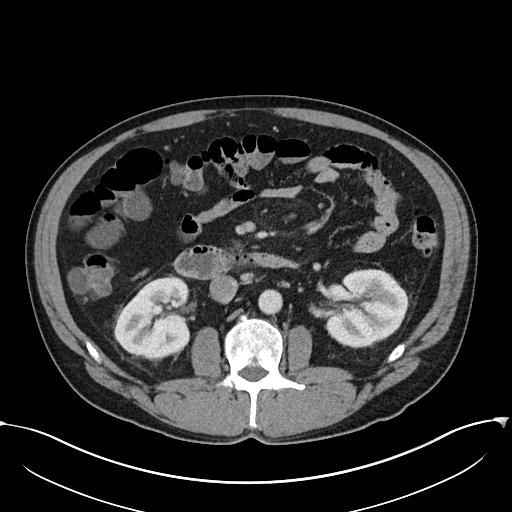
[im 83/120  soft-tissue]
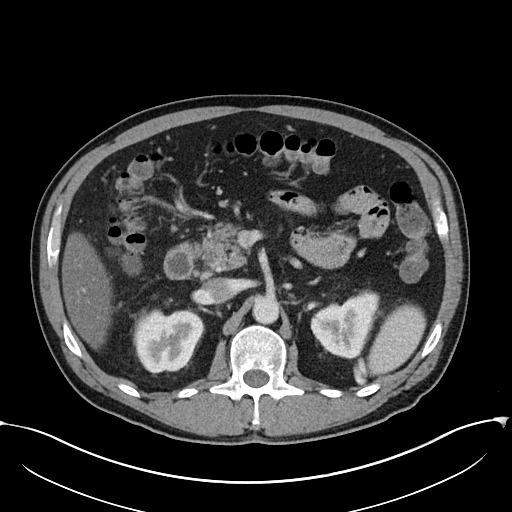
[im 83/120  bone]
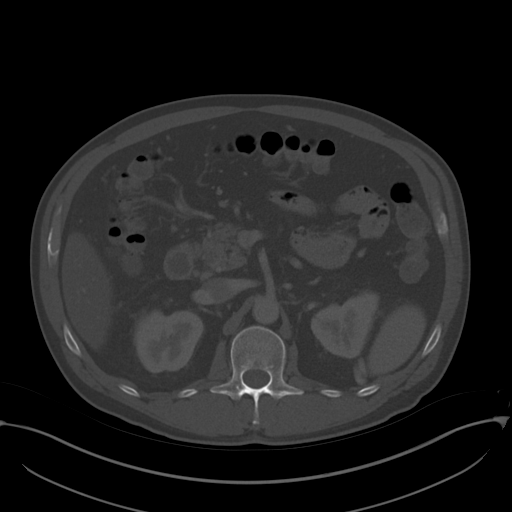
[im 92/120  soft-tissue]
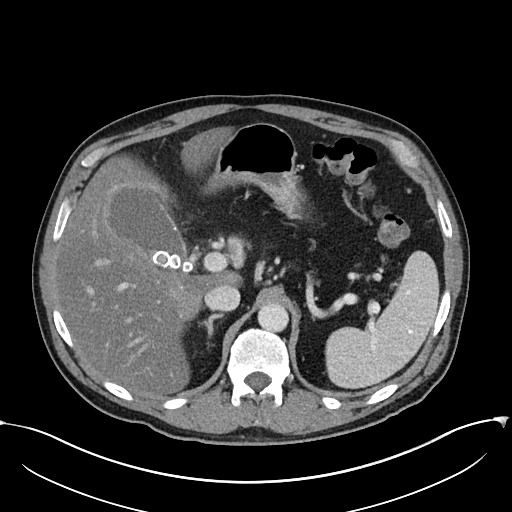
[im 101/120  soft-tissue]
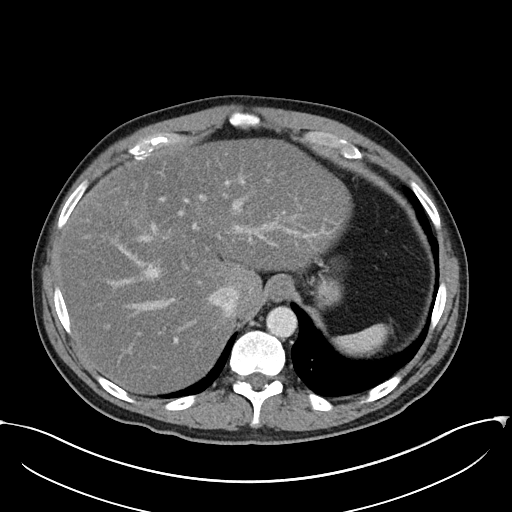
[im 110/120  soft-tissue]
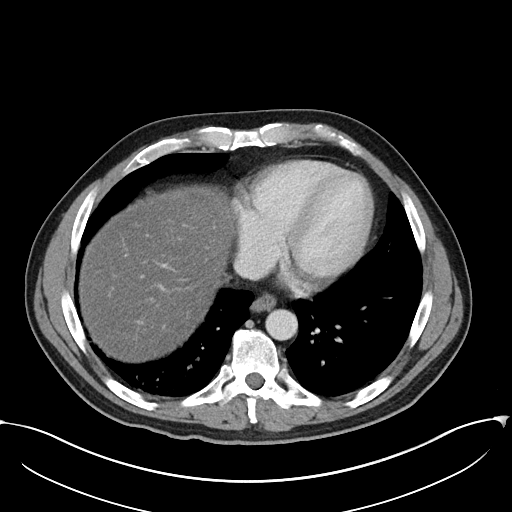

[Series 5: abdomen 3.0 mpr cor · coronal · 0.83mm/px · 3 of 98 slices shown]
[im 33/98  soft-tissue]
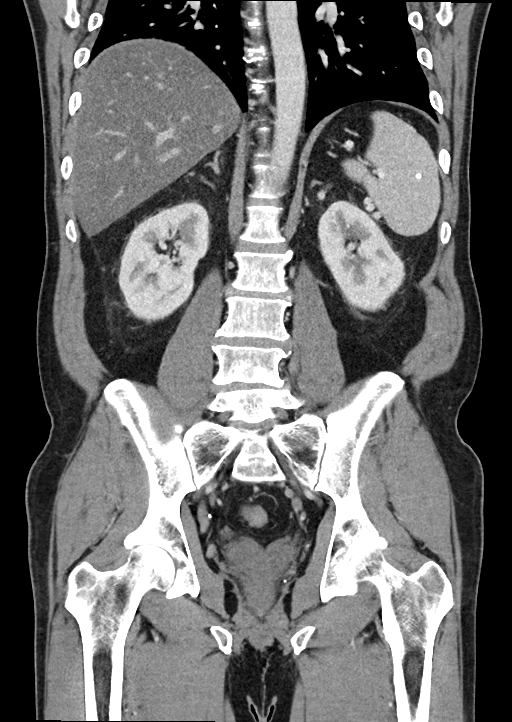
[im 44/98  soft-tissue]
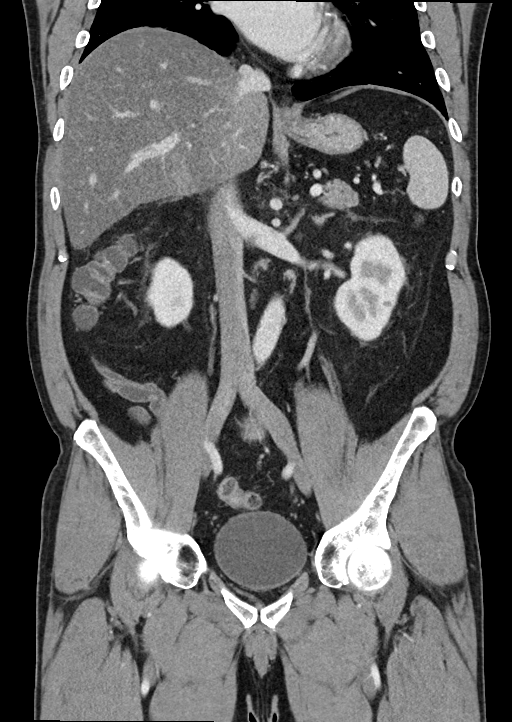
[im 54/98  soft-tissue]
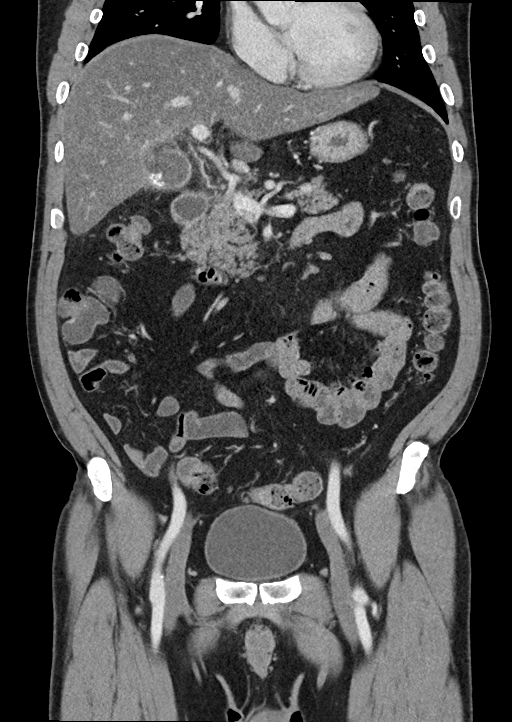

[15 of 46 positions shown; findings below may reference images not displayed]

RADIATION DOSE REDUCTION: This exam was performed according to the
departmental dose-optimization program which includes automated
exposure control, adjustment of the mA and/or kV according to
patient size and/or use of iterative reconstruction technique.

CONTRAST:  100mL OMNIPAQUE IOHEXOL 300 MG/ML  SOLN
FINDINGS: Lower chest: The visualized lung bases are clear.

No intra-abdominal free air or free fluid.

Hepatobiliary: Severe fatty liver. No intrahepatic biliary
dilatation. Multiple gallstones. There is a stone in the neck of the
gallbladder. There is haziness of the gallbladder wall and
pericholecystic fat concerning for acute cholecystitis. Further
evaluation with right upper quadrant ultrasound recommended.

Pancreas: Unremarkable. No pancreatic ductal dilatation or
surrounding inflammatory changes.

Spleen: Several small calcified splenic granuloma. The spleen is
otherwise unremarkable.

Adrenals/Urinary Tract: The adrenal glands unremarkable. There is no
hydronephrosis on either side. There is symmetric enhancement and
excretion of contrast by both kidneys. Subcentimeter bilateral renal
hypodense lesions are too small to characterize. The visualized
ureters and urinary bladder appear unremarkable.

Stomach/Bowel: There is sigmoid diverticulosis. Minimal haziness
adjacent to a sigmoid diverticula in the anterior lower abdomen
([DATE] and coronal [DATE]), likely chronic. Mild acute diverticulitis
is less likely. There is no bowel obstruction. The appendix is
normal.

Vascular/Lymphatic: The abdominal aorta and IVC are unremarkable. No
portal venous gas. There is no adenopathy.

Reproductive: The prostate and seminal vesicles are grossly
unremarkable. No pelvic mass.

Other: None

Musculoskeletal: Mild degenerative changes of the spine. No acute
osseous pathology.
IMPRESSION: 1. Cholelithiasis with findings concerning for acute cholecystitis.
Further evaluation with right upper quadrant ultrasound recommended.
2. Sigmoid diverticulosis. No bowel obstruction. Normal appendix.
3. Severe fatty liver.

## 2022-10-31 ENCOUNTER — Encounter: Payer: Self-pay | Admitting: Nurse Practitioner

## 2022-10-31 DIAGNOSIS — E785 Hyperlipidemia, unspecified: Secondary | ICD-10-CM

## 2022-11-01 MED ORDER — ATORVASTATIN CALCIUM 20 MG PO TABS: 20.0000 mg | ORAL_TABLET | Freq: Every day | ORAL | 3 refills | Status: DC

## 2023-02-14 ENCOUNTER — Ambulatory Visit: Payer: Managed Care, Other (non HMO) | Admitting: Nurse Practitioner

## 2023-02-14 ENCOUNTER — Encounter: Payer: Self-pay | Admitting: Nurse Practitioner

## 2023-02-14 VITALS — BP 138/88 | HR 61 | Temp 98.1°F | Ht 71.0 in | Wt 225.2 lb

## 2023-02-14 DIAGNOSIS — L600 Ingrowing nail: Secondary | ICD-10-CM

## 2023-02-14 DIAGNOSIS — Z0000 Encounter for general adult medical examination without abnormal findings: Secondary | ICD-10-CM | POA: Diagnosis not present

## 2023-02-14 DIAGNOSIS — Z87891 Personal history of nicotine dependence: Secondary | ICD-10-CM | POA: Diagnosis not present

## 2023-02-14 DIAGNOSIS — Z23 Encounter for immunization: Secondary | ICD-10-CM | POA: Diagnosis not present

## 2023-02-14 DIAGNOSIS — Z1159 Encounter for screening for other viral diseases: Secondary | ICD-10-CM

## 2023-02-14 DIAGNOSIS — N528 Other male erectile dysfunction: Secondary | ICD-10-CM

## 2023-02-14 DIAGNOSIS — I10 Essential (primary) hypertension: Secondary | ICD-10-CM

## 2023-02-14 DIAGNOSIS — Z125 Encounter for screening for malignant neoplasm of prostate: Secondary | ICD-10-CM | POA: Diagnosis not present

## 2023-02-14 DIAGNOSIS — R454 Irritability and anger: Secondary | ICD-10-CM

## 2023-02-14 DIAGNOSIS — M5431 Sciatica, right side: Secondary | ICD-10-CM | POA: Insufficient documentation

## 2023-02-14 DIAGNOSIS — R7303 Prediabetes: Secondary | ICD-10-CM

## 2023-02-14 DIAGNOSIS — E785 Hyperlipidemia, unspecified: Secondary | ICD-10-CM | POA: Diagnosis not present

## 2023-02-14 HISTORY — DX: Encounter for general adult medical examination without abnormal findings: Z00.00

## 2023-02-14 LAB — CBC
HCT: 44.2 % (ref 39.0–52.0)
Hemoglobin: 15.1 g/dL (ref 13.0–17.0)
MCHC: 34.1 g/dL (ref 30.0–36.0)
MCV: 96.3 fL (ref 78.0–100.0)
Platelets: 182 10*3/uL (ref 150.0–400.0)
RBC: 4.59 Mil/uL (ref 4.22–5.81)
RDW: 12.7 % (ref 11.5–15.5)
WBC: 4.5 10*3/uL (ref 4.0–10.5)

## 2023-02-14 LAB — COMPREHENSIVE METABOLIC PANEL
ALT: 54 U/L — ABNORMAL HIGH (ref 0–53)
AST: 31 U/L (ref 0–37)
Albumin: 4.5 g/dL (ref 3.5–5.2)
Alkaline Phosphatase: 48 U/L (ref 39–117)
BUN: 17 mg/dL (ref 6–23)
CO2: 31 meq/L (ref 19–32)
Calcium: 9.6 mg/dL (ref 8.4–10.5)
Chloride: 101 meq/L (ref 96–112)
Creatinine, Ser: 0.94 mg/dL (ref 0.40–1.50)
GFR: 86.82 mL/min (ref >60.00)
Glucose, Bld: 115 mg/dL — ABNORMAL HIGH (ref 70–99)
Potassium: 3.9 meq/L (ref 3.5–5.1)
Sodium: 138 meq/L (ref 135–145)
Total Bilirubin: 0.9 mg/dL (ref 0.2–1.2)
Total Protein: 7.4 g/dL (ref 6.0–8.3)

## 2023-02-14 LAB — LIPID PANEL
Cholesterol: 211 mg/dL — ABNORMAL HIGH (ref 0–200)
HDL: 36.9 mg/dL — ABNORMAL LOW (ref >39.00)
LDL Cholesterol: 103 mg/dL — ABNORMAL HIGH (ref 0–99)
NonHDL: 174.56
Total CHOL/HDL Ratio: 6
Triglycerides: 356 mg/dL — ABNORMAL HIGH (ref 0.0–149.0)
VLDL: 71.2 mg/dL — ABNORMAL HIGH (ref 0.0–40.0)

## 2023-02-14 LAB — POCT GLYCOSYLATED HEMOGLOBIN (HGB A1C): Hemoglobin A1C: 6 % — AB (ref 4.0–5.6)

## 2023-02-14 LAB — URINALYSIS, MICROSCOPIC ONLY: RBC / HPF: NONE SEEN (ref 0–?)

## 2023-02-14 LAB — PSA: PSA: 1.78 ng/mL (ref 0.10–4.00)

## 2023-02-14 LAB — TSH: TSH: 2.12 u[IU]/mL (ref 0.35–5.50)

## 2023-02-14 NOTE — Patient Instructions (Addendum)
Nice to see you today I will be in touch with the labs once I have reviewed them Follow up with me in 1 year for your next physical, sooner if you need me  If you decide that you want therapy or medication let me know

## 2023-02-14 NOTE — Assessment & Plan Note (Signed)
Continue doing tadalafil as needed.

## 2023-02-14 NOTE — Assessment & Plan Note (Signed)
Patient currently maintained on valsartan-hydrochlorothiazide.  Tolerating medication well.  Continue medication as prescribed

## 2023-02-14 NOTE — Progress Notes (Signed)
Established Patient Office Visit  Subjective   Patient ID: Steven Ramirez, male    DOB: 03/26/1961  Age: 62 y.o. MRN: [id number]  Chief Complaint  Patient presents with   Annual Exam    Yes to flu     HPI   HTN: currently on valsartan-hydrochlorothiazide.  Tolerates medication well  HLD: state that he stopped taking the statin medication for approx 60 days.  Patient had arthralgias  ED: states that he takes tadalafil as needed and it is helpful   Back Pain: state sthat he has the history of back surgery. States that he can point it to a day when he lifted someing. States has been 6 weeks ago. States that when he sneezes he  will lift his right leg because o fthe pain that shoots down the leg   Ingrown left great toe for approx 3-4 weeks.   for complete physical and follow up of chronic conditions.  Immunizations: -Tetanus: Completed in 2021 -Influenza:  up date today  -Shingles: Completed Shingrix series -Pneumonia: Too young  Diet: Fair diet. States that he is eating 2-3 meals a day. States that he will snack. States that he will drink coffee, water  Exercise: No regular exercise. Statesa that he will get 12K steps on vacation. But at home not so much exercise   Eye exam: Completes annually. Wears glasses   Dental exam: Completes semi-annually    Colonoscopy: Completed in 09/29/2021, repeat in 7 years Lung Cancer Screening: N/A  PSA: Due  Sleep: states that he goes to bed around 11 and will get up around 645-730. States most of the time he feels rested. States that he does snore      02/14/2023   11:06 AM 07/26/2022    8:31 AM 08/14/2021   12:37 PM  PHQ9 SCORE ONLY  PHQ-9 Total Score 0 0 0       02/14/2023   11:06 AM 07/26/2022    8:32 AM  GAD 7 : Generalized Anxiety Score  Nervous, Anxious, on Edge 0 0  Control/stop worrying 0 0  Worry too much - different things 0 0  Trouble relaxing 0 0  Restless 0 0  Easily annoyed or irritable 0 0  Afraid - awful  might happen 0 0  Total GAD 7 Score 0 0  Anxiety Difficulty Not difficult at all Not difficult at all         Review of Systems  Constitutional:  Negative for chills and fever.  Respiratory:  Negative for shortness of breath.   Cardiovascular:  Negative for chest pain and leg swelling.  Gastrointestinal:  Negative for abdominal pain, blood in stool, constipation, diarrhea, nausea and vomiting.       BM daily   Genitourinary:  Negative for dysuria and hematuria.  Neurological:  Negative for tingling and headaches.  Psychiatric/Behavioral:  Negative for hallucinations and suicidal ideas.       Objective:     BP 138/88   Pulse 61   Temp 98.1 F (36.7 C) (Oral)   Ht 5\' 11"  (1.803 m)   Wt 225 lb 3.2 oz (102.2 kg)   SpO2 97%   BMI 31.41 kg/m  BP Readings from Last 3 Encounters:  02/14/23 138/88  07/26/22 130/84  10/26/21 127/80   Wt Readings from Last 3 Encounters:  02/14/23 225 lb 3.2 oz (102.2 kg)  07/26/22 224 lb 2 oz (101.7 kg)  10/24/21 220 lb 0.3 oz (99.8 kg)      Physical  Exam Vitals and nursing note reviewed.  Constitutional:      Appearance: Normal appearance.  HENT:     Right Ear: Tympanic membrane, ear canal and external ear normal.     Left Ear: Tympanic membrane, ear canal and external ear normal.     Mouth/Throat:     Mouth: Mucous membranes are moist.     Pharynx: Oropharynx is clear.  Eyes:     Extraocular Movements: Extraocular movements intact.     Pupils: Pupils are equal, round, and reactive to light.  Cardiovascular:     Rate and Rhythm: Normal rate and regular rhythm.     Pulses: Normal pulses.     Heart sounds: Normal heart sounds.  Pulmonary:     Effort: Pulmonary effort is normal.     Breath sounds: Normal breath sounds.  Abdominal:     General: Bowel sounds are normal. There is no distension.     Palpations: There is no mass.     Tenderness: There is no abdominal tenderness.     Hernia: No hernia is present.  Musculoskeletal:      Right lower leg: No edema.     Left lower leg: No edema.  Lymphadenopathy:     Cervical: No cervical adenopathy.  Skin:    General: Skin is warm.  Neurological:     General: No focal deficit present.     Mental Status: He is alert.     Deep Tendon Reflexes:     Reflex Scores:      Bicep reflexes are 2+ on the right side and 2+ on the left side.      Patellar reflexes are 2+ on the right side and 2+ on the left side.    Comments: Bilateral upper and lower extremity strength 5/5  Psychiatric:        Mood and Affect: Mood normal.        Behavior: Behavior normal.        Thought Content: Thought content normal.        Judgment: Judgment normal.      Results for orders placed or performed in visit on 02/14/23  Urine Microscopic  Result Value Ref Range   WBC, UA 0-2/hpf 0-2/hpf   RBC / HPF none seen 0-2/hpf   Squamous Epithelial / HPF Rare(0-4/hpf) Rare(0-4/hpf)  POCT glycosylated hemoglobin (Hb A1C)  Result Value Ref Range   Hemoglobin A1C 6.0 (A) 4.0 - 5.6 %   HbA1c POC (<> result, manual entry)     HbA1c, POC (prediabetic range)     HbA1c, POC (controlled diabetic range)        The 10-year ASCVD risk score (Arnett DK, et al., 2019) is: 12.1%    Assessment & Plan:   Problem List Items Addressed This Visit       Cardiovascular and Mediastinum   Essential hypertension    Patient currently maintained on valsartan-hydrochlorothiazide.  Tolerating medication well.  Continue medication as prescribed      Relevant Orders   EKG 12-Lead (Completed)     Nervous and Auditory   Sciatica, right side    Patient sounds like sciatica right side.  Does have a history of lumbar surgery.  Will do some rehab exercises if ineffective consider steroids.  Staff effective we will get imaging of the back no red flags on exam today        Musculoskeletal and Integument   Ingrown toenail    Left great toe.  Scabbed over where he had  some bleeding and digging.  Not currently  infected.  Ambulatory referral to podiatry for further evaluation and management      Relevant Orders   Ambulatory referral to Podiatry     Other   Hyperlipidemia    History of the same.  Patient was on atorvastatin but had arthralgias and self discontinued.  Will check cholesterol levels today      Relevant Orders   Lipid panel   Other male erectile dysfunction    Continue doing tadalafil as needed.      Prediabetes    A1c of 6.0% today.  Continue working on healthy lifestyle modifications and watching weight      Relevant Orders   Lipid panel   POCT glycosylated hemoglobin (Hb A1C) (Completed)   Preventative health care - Primary    Discussed age-appropriate immunizations and screening exams.  Did review patient's personal, surgical, social, family histories.  Patient is up-to-date on all age-appropriate vaccinations he would like.  We will update his flu vaccine today.  Patient is up-to-date on CRC screening.  Will do PSA for prostate cancer screening today.  Patient was given information at discharge about preventative healthcare maintenance with anticipatory guidance.      Relevant Orders   CBC   Comprehensive metabolic panel   TSH   Former smoker    History of former cigar smoker check urine microscopy for microscopic hematuria      Relevant Orders   Urine Microscopic (Completed)   Irritability    Patient states that his wife and himself notes that he been more irritable as of late.  States he did lose his mother earlier this year.  We had a conversation in regards to therapy versus medication management.  Patient would like to hold off currently.  If patient decides he would like medication we will likely start with an SSRI such as Zoloft or Prozac.  Patient can reach out at any point in time he like to start with his medications or start therapy.      Other Visit Diagnoses     Encounter for hepatitis C screening test for low risk patient       Relevant Orders    Hepatitis C Antibody   Screening for prostate cancer       Relevant Orders   PSA   Need for influenza vaccination       Relevant Orders   Flu vaccine trivalent PF, 6mos and older(Flulaval,Afluria,Fluarix,Fluzone) (Completed)       Return in about 1 year (around 02/14/2024).    Steven Nine, NP

## 2023-02-14 NOTE — Assessment & Plan Note (Signed)
Discussed age-appropriate immunizations and screening exams.  Did review patient's personal, surgical, social, family histories.  Patient is up-to-date on all age-appropriate vaccinations he would like.  We will update his flu vaccine today.  Patient is up-to-date on CRC screening.  Will do PSA for prostate cancer screening today.  Patient was given information at discharge about preventative healthcare maintenance with anticipatory guidance.

## 2023-02-14 NOTE — Assessment & Plan Note (Signed)
Patient states that his wife and himself notes that he been more irritable as of late.  States he did lose his mother earlier this year.  We had a conversation in regards to therapy versus medication management.  Patient would like to hold off currently.  If patient decides he would like medication we will likely start with an SSRI such as Zoloft or Prozac.  Patient can reach out at any point in time he like to start with his medications or start therapy.

## 2023-02-14 NOTE — Assessment & Plan Note (Signed)
History of the same.  Patient was on atorvastatin but had arthralgias and self discontinued.  Will check cholesterol levels today

## 2023-02-14 NOTE — Assessment & Plan Note (Signed)
History of former cigar smoker check urine microscopy for microscopic hematuria

## 2023-02-14 NOTE — Assessment & Plan Note (Signed)
Patient sounds like sciatica right side.  Does have a history of lumbar surgery.  Will do some rehab exercises if ineffective consider steroids.  Staff effective we will get imaging of the back no red flags on exam today

## 2023-02-14 NOTE — Assessment & Plan Note (Signed)
A1c of 6.0% today.  Continue working on healthy lifestyle modifications and watching weight

## 2023-02-14 NOTE — Assessment & Plan Note (Signed)
Left great toe.  Scabbed over where he had some bleeding and digging.  Not currently infected.  Ambulatory referral to podiatry for further evaluation and management

## 2023-02-15 LAB — HEPATITIS C ANTIBODY: Hepatitis C Ab: NONREACTIVE

## 2023-02-19 ENCOUNTER — Encounter: Payer: Self-pay | Admitting: Nurse Practitioner

## 2023-03-04 NOTE — Telephone Encounter (Signed)
Left v/m, and sent my chart note requesting pt to call [phone] for additional info sending note to [facility] triage.

## 2023-03-04 NOTE — Telephone Encounter (Signed)
Can we see mychart message and triage patient

## 2023-03-05 NOTE — Telephone Encounter (Signed)
Seer mychart note. Sending to Audria Nine NP.

## 2023-03-07 NOTE — Telephone Encounter (Signed)
I spoke with pt;pt said the burning type pain below diaphragm has lessened the last couple of days and starting last night and today 03/07/23 pt has had no abd pain or discomfort. Pt has had no fever,no N&V and no CP or SOB. Pain level now is 0. Pt is not sure what caused the previous burning pain but pt is glad it is gone. Pt will cb if any symptoms return and pt said if [facility] had any comments or suggestions to my chart him back. UC & ED precautions given and pt voiced understanding. Sending note to Audria Nine NP.

## 2023-03-13 ENCOUNTER — Ambulatory Visit: Payer: 59 | Admitting: Dermatology

## 2023-10-08 ENCOUNTER — Ambulatory Visit (INDEPENDENT_AMBULATORY_CARE_PROVIDER_SITE_OTHER): Admitting: Nurse Practitioner

## 2023-10-08 VITALS — BP 122/86 | HR 62 | Temp 98.2°F | Ht 71.0 in | Wt 229.0 lb

## 2023-10-08 DIAGNOSIS — Z8639 Personal history of other endocrine, nutritional and metabolic disease: Secondary | ICD-10-CM | POA: Diagnosis not present

## 2023-10-08 DIAGNOSIS — E785 Hyperlipidemia, unspecified: Secondary | ICD-10-CM | POA: Diagnosis not present

## 2023-10-08 DIAGNOSIS — I10 Essential (primary) hypertension: Secondary | ICD-10-CM

## 2023-10-08 DIAGNOSIS — R7303 Prediabetes: Secondary | ICD-10-CM | POA: Diagnosis not present

## 2023-10-08 LAB — HEPATIC FUNCTION PANEL
ALT: 56 U/L — ABNORMAL HIGH (ref 0–53)
AST: 32 U/L (ref 0–37)
Albumin: 4.5 g/dL (ref 3.5–5.2)
Alkaline Phosphatase: 41 U/L (ref 39–117)
Bilirubin, Direct: 0.2 mg/dL (ref 0.0–0.3)
Total Bilirubin: 0.9 mg/dL (ref 0.2–1.2)
Total Protein: 7.5 g/dL (ref 6.0–8.3)

## 2023-10-08 LAB — LIPID PANEL
Cholesterol: 141 mg/dL (ref 0–200)
HDL: 30 mg/dL — ABNORMAL LOW (ref >39.00)
LDL Cholesterol: 35 mg/dL (ref 0–99)
NonHDL: 110.9
Total CHOL/HDL Ratio: 5
Triglycerides: 378 mg/dL — ABNORMAL HIGH (ref 0.0–149.0)
VLDL: 75.6 mg/dL — ABNORMAL HIGH (ref 0.0–40.0)

## 2023-10-08 LAB — POCT GLYCOSYLATED HEMOGLOBIN (HGB A1C): Hemoglobin A1C: 5.9 % — AB (ref 4.0–5.6)

## 2023-10-08 MED ORDER — VALSARTAN-HYDROCHLOROTHIAZIDE 320-12.5 MG PO TABS: 1.0000 | ORAL_TABLET | Freq: Every day | ORAL | 3 refills | Status: AC

## 2023-10-08 NOTE — Assessment & Plan Note (Signed)
 History of diabetes per patient report.  Last 20 years A1c's have been well-controlled.  A1c of 5.9% today.  Continue working on lifestyle modifications

## 2023-10-08 NOTE — Patient Instructions (Signed)
Nice to see you today I will be in touch with the labs once I have them Follow up with me in 6 months

## 2023-10-08 NOTE — Assessment & Plan Note (Signed)
 Patient currently maintained on valsartan  -hydrochlorothiazide  320-12.5 mg daily.  Blood pressure well-controlled on regimen continue medication as prescribed.  Refill provided today

## 2023-10-08 NOTE — Progress Notes (Signed)
 Established Patient Office Visit  Subjective   Patient ID: Steven Ramirez, male    DOB: 1961-04-17  Age: 63 y.o. MRN: [id number]  Chief Complaint  Patient presents with   Medication Refill    Valsartan -hydrochlorothiazide       Diabetes      Prediabetes: states that he is improving his diet. States that he is paying attention to carb intake. He was up to 235 and is working on [employer]. He is eating 3 meals a day and some snakcs. He is doing healthy cashew and 85% chocolate He has been walking more. He is doing it 2-3 times a week. He is doing 45 mins at a time  HTN: he does have a cuff at home and does not check. Has been taking the medication as prescribed and doing well     Review of Systems  Constitutional:  Negative for chills and fever.  Respiratory:  Negative for shortness of breath.   Cardiovascular:  Negative for chest pain.  Gastrointestinal:        Bm daily   Neurological:  Negative for dizziness and headaches.      Objective:     BP 122/86   Pulse 62   Temp 98.2 F (36.8 C) (Oral)   Ht 5\' 11"  (1.803 m)   Wt 229 lb (103.9 kg)   SpO2 97%   BMI 31.94 kg/m  BP Readings from Last 3 Encounters:  10/08/23 122/86  02/14/23 138/88  07/26/22 130/84   Wt Readings from Last 3 Encounters:  10/08/23 229 lb (103.9 kg)  02/14/23 225 lb 3.2 oz (102.2 kg)  07/26/22 224 lb 2 oz (101.7 kg)   SpO2 Readings from Last 3 Encounters:  10/08/23 97%  02/14/23 97%  07/26/22 96%      Physical Exam Vitals and nursing note reviewed.  Constitutional:      Appearance: Normal appearance.  Cardiovascular:     Rate and Rhythm: Normal rate and regular rhythm.     Heart sounds: Normal heart sounds.  Pulmonary:     Effort: Pulmonary effort is normal.     Breath sounds: Normal breath sounds.  Neurological:     Mental Status: He is alert.      Results for orders placed or performed in visit on 10/08/23  POCT glycosylated hemoglobin (Hb A1C)  Result Value Ref Range    Hemoglobin A1C 5.9 (A) 4.0 - 5.6 %   HbA1c POC (<> result, manual entry)     HbA1c, POC (prediabetic range)     HbA1c, POC (controlled diabetic range)        The 10-year ASCVD risk score (Arnett DK, et al., 2019) is: 14.7%    Assessment & Plan:   Problem List Items Addressed This Visit       Cardiovascular and Mediastinum   Essential hypertension   Patient currently maintained on valsartan  -hydrochlorothiazide  320-12.5 mg daily.  Blood pressure well-controlled on regimen continue medication as prescribed.  Refill provided today      Relevant Medications   atorvastatin  (LIPITOR) 20 MG tablet   valsartan -hydrochlorothiazide  (DIOVAN  HCT) 320-12.5 MG tablet     Other   History of diabetes mellitus   Relevant Orders   POCT glycosylated hemoglobin (Hb A1C) (Completed)   Hyperlipidemia - Primary   Relevant Medications   atorvastatin  (LIPITOR) 20 MG tablet   valsartan -hydrochlorothiazide  (DIOVAN  HCT) 320-12.5 MG tablet   Other Relevant Orders   Lipid panel   Hepatic function panel   Prediabetes   History  of diabetes per patient report.  Last 20 years A1c's have been well-controlled.  A1c of 5.9% today.  Continue working on lifestyle modifications      Relevant Orders   POCT glycosylated hemoglobin (Hb A1C) (Completed)    Return in about 6 months (around 04/09/2024).    Margarie Shay, NP

## 2023-10-11 ENCOUNTER — Ambulatory Visit: Payer: Self-pay | Admitting: Nurse Practitioner

## 2023-10-22 ENCOUNTER — Ambulatory Visit
Admission: EM | Admit: 2023-10-22 | Discharge: 2023-10-22 | Disposition: A | Discharge status: Home / Self Care | Attending: Emergency Medicine | Admitting: Emergency Medicine

## 2023-10-22 ENCOUNTER — Encounter: Payer: Self-pay | Admitting: Emergency Medicine

## 2023-10-22 DIAGNOSIS — B349 Viral infection, unspecified: Secondary | ICD-10-CM

## 2023-10-22 LAB — POC COVID19/FLU A&B COMBO
Covid Antigen, POC: NEGATIVE
Influenza A Antigen, POC: NEGATIVE
Influenza B Antigen, POC: NEGATIVE

## 2023-10-22 NOTE — Discharge Instructions (Signed)
 The COVID and flu tests are negative.   Take Tylenol or ibuprofen as needed for fever or discomfort.  Take plain Mucinex as needed for congestion.  Rest and keep yourself hydrated.    Follow-up with your primary care provider if your symptoms are not improving.

## 2023-10-22 NOTE — ED Provider Notes (Signed)
 Steven Ramirez    CSN: [id number] Arrival date & time: 10/22/23  1631      History   Chief Complaint Chief Complaint  Patient presents with   Cough   Fever   Fatigue    HPI Steven Ramirez is a 63 y.o. male.  Patient presents on day 5 of fever, body aches, headache, fatigue, nonproductive cough, diarrhea.  He took Tylenol  1 hour ago.  No shortness of breath, abdominal pain, vomiting.  Patient returned from trip to [place] yesterday.    The history is provided by the patient and medical records.    Past Medical History:  Diagnosis Date   Allergy 1982   hay fever, dust, pets and feathers   Diabetes mellitus without complication (HCC) 2014   Controlled with diet   Hyperlipidemia    Hypertension 1995   controlled with meds   Preventative health care 02/14/2023    Patient Active Problem List   Diagnosis Date Noted   Preventative health care 02/14/2023   Former smoker 02/14/2023   Sciatica, right side 02/14/2023   Ingrown toenail 02/14/2023   Irritability 02/14/2023   Prediabetes 07/26/2022   Neck swelling 07/26/2022   Acute cholecystitis 10/23/2021   Other male erectile dysfunction 10/05/2021   History of diabetes mellitus 08/14/2021   Essential hypertension 08/14/2021   Class 1 obesity due to excess calories with serious comorbidity and body mass index (BMI) of 30.0 to 30.9 in adult 08/14/2021   Hyperlipidemia 08/14/2021    Past Surgical History:  Procedure Laterality Date   ANAL FISSURECTOMY     BACK SURGERY     CHOLECYSTECTOMY  June 2023   removed   COLONOSCOPY WITH PROPOFOL  N/A 09/29/2021   Procedure: COLONOSCOPY WITH PROPOFOL ;  Surgeon: Luke Salaam, MD;  Location: [facility] ENDOSCOPY;  Service: Gastroenterology;  Laterality: N/A;   HEMORRHOID SURGERY     SPINE SURGERY     VASECTOMY         Home Medications    Prior to Admission medications   Medication Sig Start Date End Date Taking? Authorizing Provider  atorvastatin  (LIPITOR) 20 MG tablet  Take 20 mg by mouth daily. 03/15/23   [provider]  cetirizine (ZYRTEC) 10 MG tablet  07/08/11   [provider]  valsartan -hydrochlorothiazide  (DIOVAN  HCT) 320-12.5 MG tablet Take 1 tablet by mouth daily. 10/08/23   Dorothe Gaster, NP    Family History Family History  Problem Relation Age of Onset   Crohn's disease Mother    Hypothyroidism Mother    Gout Father    Hypertension Father    Diabetes Brother    Diabetes Brother    Cancer Paternal Grandmother        lung cancer?   Hypothyroidism Daughter    Colon cancer Neg Hx    Prostate cancer Neg Hx    Breast cancer Neg Hx     Social History Social History   Tobacco Use   Smoking status: Former    Types: Cigars    Quit date: 05/14/2008    Years since quitting: 15.4   Smokeless tobacco: Former   Tobacco comments:    Cigars and Skol occasionally until quit date  Vaping Use   Vaping status: Never Used  Substance Use Topics   Alcohol use: Yes    Alcohol/week: 18.0 standard drinks of alcohol    Types: 14 Glasses of wine, 2 Cans of beer, 2 Shots of liquor per week    Comment: 0.5 bottle of wine at  night   Drug use: Never     Allergies   Prunus persica   Review of Systems Review of Systems  Constitutional:  Positive for fatigue and fever.  HENT:  Negative for ear pain and sore throat.   Respiratory:  Positive for cough. Negative for shortness of breath.   Cardiovascular:  Negative for chest pain and palpitations.  Gastrointestinal:  Positive for diarrhea. Negative for abdominal pain and vomiting.  Neurological:  Positive for headaches.     Physical Exam Triage Vital Signs ED Triage Vitals  Encounter Vitals Group     BP 10/22/23 1644 124/79     Systolic BP Percentile --      Diastolic BP Percentile --      Pulse Rate 10/22/23 1644 (!) 116     Resp 10/22/23 1644 20     Temp 10/22/23 1644 100.1 F (37.8 C)     Temp Source 10/22/23 1644 Oral     SpO2 10/22/23 1640 95 %     Weight --       Height --      Head Circumference --      Peak Flow --      Pain Score 10/22/23 1640 5     Pain Loc --      Pain Education --      Exclude from Growth Chart --    No data found.  Updated Vital Signs BP 124/79 (BP Location: Left Arm)   Pulse (!) 116   Temp 100.1 F (37.8 C) (Oral)   Resp 20   SpO2 95%   Visual Acuity Right Eye Distance:   Left Eye Distance:   Bilateral Distance:    Right Eye Near:   Left Eye Near:    Bilateral Near:     Physical Exam Constitutional:      General: He is not in acute distress. HENT:     Right Ear: Tympanic membrane normal.     Left Ear: Tympanic membrane normal.     Nose: Nose normal.     Mouth/Throat:     Mouth: Mucous membranes are moist.     Pharynx: Oropharynx is clear.  Cardiovascular:     Rate and Rhythm: Normal rate and regular rhythm.     Heart sounds: Normal heart sounds.  Pulmonary:     Effort: Pulmonary effort is normal. No respiratory distress.     Breath sounds: Normal breath sounds.     Comments: Frequent nonproductive cough. Abdominal:     General: Bowel sounds are normal.     Palpations: Abdomen is soft.     Tenderness: There is no abdominal tenderness. There is no guarding or rebound.  Neurological:     Mental Status: He is alert.      UC Treatments / Results  Labs (all labs ordered are listed, but only abnormal results are displayed) Labs Reviewed  POC COVID19/FLU A&B COMBO - Normal    EKG   Radiology No results found.  Procedures Procedures (including critical care time)  Medications Ordered in UC Medications - No data to display  Initial Impression / Assessment and Plan / UC Course  I have reviewed the triage vital signs and the nursing notes.  Pertinent labs & imaging results that were available during my care of the patient were reviewed by me and considered in my medical decision making (see chart for details).    Viral illness.  Lungs are clear at this time and O2 sat is 95% on room  air.  Patient is on day 5 of his symptoms.  Rapid COVID and flu negative.  He declines antibiotic at this time but will reconsider if his symptoms are not improving or get worse.  Discussed symptomatic treatment including Tylenol  or ibuprofen  as needed for fever or discomfort, plain Mucinex as needed for congestion, rest, hydration.  Instructed patient to follow-up with PCP if not improving.  ED precautions given.  Patient agrees to plan of care.  Final Clinical Impressions(s) / UC Diagnoses   Final diagnoses:  Viral illness     Discharge Instructions      The COVID and flu tests are negative.   Take Tylenol  or ibuprofen  as needed for fever or discomfort.  Take plain Mucinex as needed for congestion.  Rest and keep yourself hydrated.    Follow-up with your primary care provider if your symptoms are not improving.     ED Prescriptions   None    PDMP not reviewed this encounter.   Wellington Half, NP 10/22/23 [phone]

## 2023-10-22 NOTE — ED Triage Notes (Signed)
 Patient complains of fever, diarrhea, dry cough, body aches, and fatigue x 4 days. Patient taking tylenol  1 hour prior to arrival. Head pain rates 5/10.

## 2023-10-23 ENCOUNTER — Encounter: Payer: Self-pay | Admitting: Nurse Practitioner

## 2023-10-24 ENCOUNTER — Telehealth: Payer: Self-pay | Admitting: Emergency Medicine

## 2023-10-24 MED ORDER — AMOXICILLIN-POT CLAVULANATE 875-125 MG PO TABS: 1.0000 | ORAL_TABLET | Freq: Two times a day (BID) | ORAL | 0 refills | Status: AC

## 2023-10-24 NOTE — Telephone Encounter (Signed)
 Patient returns to clinic requesting antibiotic originally offered at initial visit as symptoms are not improving, documentation does confirm that he was offered antibiotics, Augmentin  sent to pharmacy at this time advised to follow-up for any further concerns

## 2023-10-30 ENCOUNTER — Ambulatory Visit (HOSPITAL_COMMUNITY)
Admission: RE | Admit: 2023-10-30 | Discharge: 2023-10-30 | Disposition: A | Discharge status: Home / Self Care | Source: Ambulatory Visit | Attending: Emergency Medicine | Admitting: Emergency Medicine

## 2023-10-30 ENCOUNTER — Ambulatory Visit
Admission: RE | Admit: 2023-10-30 | Discharge: 2023-10-30 | Disposition: A | Discharge status: Home / Self Care | Source: Ambulatory Visit | Attending: Emergency Medicine

## 2023-10-30 VITALS — BP 109/76 | HR 91 | Temp 97.3°F | Resp 20

## 2023-10-30 DIAGNOSIS — R051 Acute cough: Secondary | ICD-10-CM | POA: Diagnosis not present

## 2023-10-30 DIAGNOSIS — R0789 Other chest pain: Secondary | ICD-10-CM

## 2023-10-30 DIAGNOSIS — R0602 Shortness of breath: Secondary | ICD-10-CM

## 2023-10-30 MED ORDER — ALBUTEROL SULFATE HFA 108 (90 BASE) MCG/ACT IN AERS: 2.0000 | INHALATION_SPRAY | RESPIRATORY_TRACT | 0 refills | Status: AC | PRN

## 2023-10-30 MED ORDER — PREDNISONE 10 MG (21) PO TBPK
ORAL_TABLET | Freq: Every day | ORAL | 0 refills | Status: AC
Start: 2023-10-30 — End: ?

## 2023-10-30 NOTE — ED Triage Notes (Signed)
 Patient reports chest pain when taking a deep breath that started yesterday. Patient reports last night while laying down he started having chest Pain and was unable to get comfortable. Rate pain 2/10.

## 2023-10-30 NOTE — ED Provider Notes (Signed)
 Steven Ramirez    CSN: [id number] Arrival date & time: 10/30/23  1255      History   Chief Complaint Chief Complaint  Patient presents with   Cough    I suspect I have a mild pneumonia.  I experienced minor pain in breathing and difficultly taking a deep breath. This started Tuesday afternoon. Currently taking Amoxicillin  with 2 pills remaining. - Entered by patient   Chest Pain    HPI Steven Ramirez is a 63 y.o. male.  Patient presents with shortness of breath and chest pressure since yesterday.  He states when he was lying down last night he was unable to get comfortable due to the chest pressure.  He states it is difficult to get a deep breath.  He is concerned for pneumonia.  He has ongoing mild nonproductive cough.  He is currently on Augmentin ; 2 tablets left to take; he skipped 2 doses because of stomach upset and restarted it last night.  He denies numbness, weakness, sputum production, fever, chills.  Patient was seen here on 10/22/2023; diagnosed with viral illness; negative for COVID and flu; he declined antibiotic at that visit.  He telephoned at this urgent care on 10/24/2023 requesting the antibiotic and was started on Augmentin .  The history is provided by the patient and medical records.    Past Medical History:  Diagnosis Date   Allergy 1982   hay fever, dust, pets and feathers   Diabetes mellitus without complication (HCC) 2014   Controlled with diet   Hyperlipidemia    Hypertension 1995   controlled with meds   Preventative health care 02/14/2023    Patient Active Problem List   Diagnosis Date Noted   Preventative health care 02/14/2023   Former smoker 02/14/2023   Sciatica, right side 02/14/2023   Ingrown toenail 02/14/2023   Irritability 02/14/2023   Prediabetes 07/26/2022   Neck swelling 07/26/2022   Acute cholecystitis 10/23/2021   Other male erectile dysfunction 10/05/2021   History of diabetes mellitus 08/14/2021   Essential hypertension  08/14/2021   Class 1 obesity due to excess calories with serious comorbidity and body mass index (BMI) of 30.0 to 30.9 in adult 08/14/2021   Hyperlipidemia 08/14/2021    Past Surgical History:  Procedure Laterality Date   ANAL FISSURECTOMY     BACK SURGERY     CHOLECYSTECTOMY  June 2023   removed   COLONOSCOPY WITH PROPOFOL  N/A 09/29/2021   Procedure: COLONOSCOPY WITH PROPOFOL ;  Surgeon: Luke Salaam, MD;  Location: [facility] ENDOSCOPY;  Service: Gastroenterology;  Laterality: N/A;   HEMORRHOID SURGERY     SPINE SURGERY     VASECTOMY         Home Medications    Prior to Admission medications   Medication Sig Start Date End Date Taking? Authorizing Provider  amoxicillin -clavulanate (AUGMENTIN ) 875-125 MG tablet Take 1 tablet by mouth every 12 (twelve) hours. 10/24/23   White, Maybelle Spatz, NP  atorvastatin  (LIPITOR) 20 MG tablet Take 20 mg by mouth daily. 03/15/23   [provider]  cetirizine (ZYRTEC) 10 MG tablet  07/08/11   [provider]  valsartan -hydrochlorothiazide  (DIOVAN  HCT) 320-12.5 MG tablet Take 1 tablet by mouth daily. 10/08/23   Dorothe Gaster, NP    Family History Family History  Problem Relation Age of Onset   Crohn's disease Mother    Hypothyroidism Mother    Gout Father    Hypertension Father    Diabetes Brother    Diabetes Brother  Cancer Paternal Grandmother        lung cancer?   Hypothyroidism Daughter    Colon cancer Neg Hx    Prostate cancer Neg Hx    Breast cancer Neg Hx     Social History Social History   Tobacco Use   Smoking status: Former    Types: Cigars    Quit date: 05/14/2008    Years since quitting: 15.4   Smokeless tobacco: Former   Tobacco comments:    Cigars and Skol occasionally until quit date  Vaping Use   Vaping status: Never Used  Substance Use Topics   Alcohol use: Yes    Alcohol/week: 18.0 standard drinks of alcohol    Types: 14 Glasses of wine, 2 Cans of beer, 2 Shots of liquor per week    Comment: 0.5  bottle of wine at night   Drug use: Never     Allergies   Prunus persica   Review of Systems Review of Systems  Constitutional:  Negative for chills and fever.  Respiratory:  Positive for cough and shortness of breath.   Cardiovascular:  Positive for chest pain. Negative for palpitations.  Gastrointestinal:  Negative for nausea and vomiting.  Neurological:  Negative for dizziness, facial asymmetry, speech difficulty, weakness, numbness and headaches.     Physical Exam Triage Vital Signs ED Triage Vitals [10/30/23 1305]  Encounter Vitals Group     BP 109/76     Girls Systolic BP Percentile      Girls Diastolic BP Percentile      Boys Systolic BP Percentile      Boys Diastolic BP Percentile      Pulse Rate 91     Resp 20     Temp (!) 97.3 F (36.3 C)     Temp src      SpO2 95 %     Weight      Height      Head Circumference      Peak Flow      Pain Score      Pain Loc      Pain Education      Exclude from Growth Chart    No data found.  Updated Vital Signs BP 109/76   Pulse 91   Temp (!) 97.3 F (36.3 C)   Resp 20   SpO2 95%   Visual Acuity Right Eye Distance:   Left Eye Distance:   Bilateral Distance:    Right Eye Near:   Left Eye Near:    Bilateral Near:     Physical Exam Constitutional:      General: He is not in acute distress. HENT:     Mouth/Throat:     Mouth: Mucous membranes are moist.   Cardiovascular:     Rate and Rhythm: Normal rate and regular rhythm.     Heart sounds: Normal heart sounds.  Pulmonary:     Effort: Pulmonary effort is normal. No respiratory distress.     Breath sounds: Normal breath sounds.   Neurological:     General: No focal deficit present.     Mental Status: He is alert and oriented to person, place, and time.     Sensory: No sensory deficit.     Motor: No weakness.     Gait: Gait normal.      UC Treatments / Results  Labs (all labs ordered are listed, but only abnormal results are displayed) Labs  Reviewed - No data to display  EKG  Radiology DG Chest 2 View Result Date: 10/30/2023 CLINICAL DATA:  Cough, shortness of breath EXAM: CHEST - 2 VIEW COMPARISON:  10/04/2005 FINDINGS: The heart size and mediastinal contours are within normal limits. Both lungs are clear. The visualized skeletal structures are unremarkable. IMPRESSION: No active cardiopulmonary disease. Electronically Signed   By: Janeece Mechanic M.D.   On: 10/30/2023 15:38    Procedures Procedures (including critical care time)  Medications Ordered in UC Medications - No data to display  Initial Impression / Assessment and Plan / UC Course  I have reviewed the triage vital signs and the nursing notes.  Pertinent labs & imaging results that were available during my care of the patient were reviewed by me and considered in my medical decision making (see chart for details).    Cough, shortness of breath, chest pressure.  Afebrile and vital signs are stable.  Discussed limitations of evaluation of his symptoms in an urgent care setting.  Patient declines transfer to the ED.  He also declines EKG.  CXR negative; reviewed with patient via telephone.  Treating today with albuterol inhaler and prednisone taper.  Instructed patient to follow-up with his PCP tomorrow.  ED precautions given.  Education provided on shortness of breath and chest pain.  He agrees to plan of care.   Final Clinical Impressions(s) / UC Diagnoses   Final diagnoses:  Acute cough  Shortness of breath  Chest pressure     Discharge Instructions      Follow up with your primary care provider tomorrow.  Go to the emergency department if you have worsening symptoms.    Go to [facility] for your x-ray.    [address]  [address] [place], [place] [address] [phone]       ED Prescriptions   None    PDMP not reviewed this encounter.   Wellington Half, NP 10/30/23 [phone]

## 2023-10-30 NOTE — Discharge Instructions (Addendum)
 Follow up with your primary care provider tomorrow.  Go to the emergency department if you have worsening symptoms.    Go to [facility] for your x-ray.    [address]  [place], [place] [address] [phone]

## 2023-10-30 NOTE — ED Notes (Signed)
 Patient refused EKG. Provider Leanor Proper, NP made aware. Benefits and risk explained to patient by provider.

## 2023-10-31 ENCOUNTER — Ambulatory Visit (HOSPITAL_COMMUNITY): Payer: Self-pay

## 2024-01-09 ENCOUNTER — Other Ambulatory Visit: Payer: Self-pay | Admitting: Nurse Practitioner
